# Patient Record
Sex: Female | Born: 1995 | Race: Black or African American | Hispanic: No | Marital: Single | Smoking: Never smoker
Health system: Southern US, Community
[De-identification: ages and names within clinical notes are randomized; demographics above are authoritative.]

## PROBLEM LIST (undated history)

## (undated) ENCOUNTER — Inpatient Hospital Stay (HOSPITAL_COMMUNITY): Payer: Self-pay

## (undated) DIAGNOSIS — F909 Attention-deficit hyperactivity disorder, unspecified type: Secondary | ICD-10-CM

## (undated) DIAGNOSIS — F419 Anxiety disorder, unspecified: Secondary | ICD-10-CM

## (undated) HISTORY — PX: PELVIC LAPAROSCOPY: SHX162

---

## 2015-11-26 ENCOUNTER — Inpatient Hospital Stay (HOSPITAL_COMMUNITY)
Admission: AD | Admit: 2015-11-26 | Discharge: 2015-11-26 | Disposition: A | Discharge status: Home / Self Care | Payer: BLUE CROSS/BLUE SHIELD | Source: Ambulatory Visit | Attending: Obstetrics & Gynecology | Admitting: Obstetrics & Gynecology

## 2015-11-26 ENCOUNTER — Encounter (HOSPITAL_COMMUNITY): Payer: Self-pay | Admitting: *Deleted

## 2015-11-26 DIAGNOSIS — Z3490 Encounter for supervision of normal pregnancy, unspecified, unspecified trimester: Secondary | ICD-10-CM

## 2015-11-26 DIAGNOSIS — Z3200 Encounter for pregnancy test, result unknown: Secondary | ICD-10-CM | POA: Diagnosis present

## 2015-11-26 DIAGNOSIS — Z3201 Encounter for pregnancy test, result positive: Secondary | ICD-10-CM

## 2015-11-26 LAB — POCT PREGNANCY, URINE: Preg Test, Ur: POSITIVE — AB

## 2015-11-26 NOTE — MAU Provider Note (Signed)
S; In to see if preg. Had post test at home. Denies any painor bleeding.  O; VSS A: IUP at 4.6 wks per LMP P[facility]

## 2015-11-26 NOTE — MAU Note (Signed)
Pt presents to MAU for pregnancy test. Denies any vaginal bleeding or pain. 

## 2016-09-30 ENCOUNTER — Encounter (HOSPITAL_COMMUNITY): Payer: Self-pay

## 2019-06-25 ENCOUNTER — Emergency Department (HOSPITAL_COMMUNITY)
Admission: EM | Admit: 2019-06-25 | Discharge: 2019-06-25 | Disposition: A | Discharge status: Home / Self Care | Payer: BC Managed Care – PPO | Attending: Emergency Medicine | Admitting: Emergency Medicine

## 2019-06-25 ENCOUNTER — Encounter: Payer: Self-pay | Admitting: Emergency Medicine

## 2019-06-25 ENCOUNTER — Other Ambulatory Visit: Payer: Self-pay

## 2019-06-25 ENCOUNTER — Emergency Department (HOSPITAL_COMMUNITY): Payer: BC Managed Care – PPO

## 2019-06-25 DIAGNOSIS — N760 Acute vaginitis: Secondary | ICD-10-CM | POA: Diagnosis not present

## 2019-06-25 DIAGNOSIS — Z3202 Encounter for pregnancy test, result negative: Secondary | ICD-10-CM | POA: Insufficient documentation

## 2019-06-25 DIAGNOSIS — Z975 Presence of (intrauterine) contraceptive device: Secondary | ICD-10-CM | POA: Insufficient documentation

## 2019-06-25 DIAGNOSIS — B9689 Other specified bacterial agents as the cause of diseases classified elsewhere: Secondary | ICD-10-CM

## 2019-06-25 LAB — URINALYSIS, ROUTINE W REFLEX MICROSCOPIC
Bilirubin Urine: NEGATIVE
Glucose, UA: NEGATIVE mg/dL
Ketones, ur: NEGATIVE mg/dL
Leukocytes,Ua: NEGATIVE
Nitrite: NEGATIVE
Protein, ur: NEGATIVE mg/dL
Specific Gravity, Urine: 1.024 (ref 1.005–1.030)
pH: 5 (ref 5.0–8.0)

## 2019-06-25 LAB — WET PREP, GENITAL
Sperm: NONE SEEN
Trich, Wet Prep: NONE SEEN
Yeast Wet Prep HPF POC: NONE SEEN

## 2019-06-25 LAB — PREGNANCY, URINE: Preg Test, Ur: NEGATIVE

## 2019-06-25 MED ORDER — METRONIDAZOLE 500 MG PO TABS: 500.0000 mg | ORAL_TABLET | Freq: Two times a day (BID) | ORAL | 0 refills | Status: DC

## 2019-06-25 NOTE — Discharge Instructions (Addendum)
Your pregnancy test was negative today.  Your ultrasound shows that your IUD is lower in the uterus but still in place with no signs of perforation.  It is extremely important that you follow-up with OB/GYN regarding this so that you can have it properly removed.  Please make sure you are using protection while sexually active until this happens.

## 2019-06-25 NOTE — ED Provider Notes (Signed)
[facility] Provider Note   CSN: [id number] Arrival date & time: 06/25/19  1414    History   Chief Complaint Chief Complaint  Patient presents with  . Possible Pregnancy    HPI Olivia Thompson is a 23 y.o. female.     Olivia Thompson is a 23 y.o. female who is otherwise healthy, presents to the ED for evaluation of possible pregnancy.  Patient reports that her menstrual cycle is 2 weeks late.  She has had an IUD in place for 2 years and usually has a regular menstrual cycle monthly with this.  She reports that 2 weeks ago she had some lower abdominal cramping typical for her cycle but no bleeding.  She reports that this is the first time she has missed a period with her IUD.  She reports about 3 months ago they tried to remove her [facility]UD [facility] the [facility] per her request but they were unable to remove it and she reports they said some of the string broke away and they referred her to follow-up with OB/GYN, but she has not followed up with them yet.  She denies any persistent abdominal pain.  No vaginal bleeding or vaginal discharge.  No nausea or vomiting.  No fevers or chills.  She has taken 3 regnancy test at home over the past 2 weeks and all of them have been negative but she is very concerned and wants to be sure.  She has not attempted to follow-up with OB/GYN.     History reviewed. No pertinent past medical history.  There are no active problems to display for this patient.   History reviewed. No pertinent surgical history.   OB History    Gravida  2   Para      Term      Preterm      AB      Living        SAB      TAB      Ectopic      Multiple      Live Births               Home Medications    Prior to Admission medications   Medication Sig Start Date End Date Taking? Authorizing Provider  metroNIDAZOLE (FLAGYL) 500 MG tablet Take 1 tablet (500 mg total) by mouth 2 (two) times daily. One po bid x 7 days  06/25/19   Dartha LodgeFord, Zanyla Klebba N, PA-C    Family History No family history on file.  Social History Social History   Tobacco Use  . Smoking status: Never Smoker  . Smokeless tobacco: Never Used  Substance Use Topics  . Alcohol use: Never    Frequency: Never  . Drug use: Never     Allergies   Patient has no known allergies.   Review of Systems Review of Systems  Constitutional: Negative for chills and fever.  Gastrointestinal: Positive for abdominal pain. Negative for constipation, diarrhea, nausea and vomiting.  Genitourinary: Positive for menstrual problem. Negative for dysuria, flank pain, frequency, vaginal bleeding and vaginal discharge.  All other systems reviewed and are negative.    Physical Exam Updated Vital Signs BP 129/70 (BP Location: Right Arm)   Pulse 85   Temp 98.5 F (36.9 C) (Oral)   Resp 14   LMP 05/24/2019   SpO2 100%   Physical Exam Vitals signs and nursing note reviewed.  Constitutional:      General: She is not  in acute distress.    Appearance: She is well-developed. She is not diaphoretic.  HENT:     Head: Normocephalic and atraumatic.  Eyes:     General:        Right eye: No discharge.        Left eye: No discharge.     Pupils: Pupils are equal, round, and reactive to light.  Neck:     Musculoskeletal: Neck supple.  Cardiovascular:     Rate and Rhythm: Normal rate and regular rhythm.     Heart sounds: Normal heart sounds. No murmur. No friction rub. No gallop.   Pulmonary:     Effort: Pulmonary effort is normal. No respiratory distress.     Breath sounds: Normal breath sounds. No wheezing or rales.     Comments: Respirations equal and unlabored, patient able to speak in full sentences, lungs clear to auscultation bilaterally Abdominal:     General: Bowel sounds are normal. There is no distension.     Palpations: Abdomen is soft. There is no mass.     Tenderness: There is no abdominal tenderness. There is no guarding.     Comments:  Abdomen soft, nondistended, nontender to palpation in all quadrants without guarding or peritoneal signs  Genitourinary:    Comments: Chaperone present during pelvic exam. No external genital lesions noted. Speculum exam reveals small amount of thin gray discharge, no erythema of the vaginal walls or cervix.  Unable to visualize IUD strings.  On bimanual exam unable to palpate IUD strings.  No focal uterine or adnexal tenderness. Musculoskeletal:        General: No deformity.  Skin:    General: Skin is warm and dry.     Capillary Refill: Capillary refill takes less than 2 seconds.  Neurological:     Mental Status: She is alert.     Coordination: Coordination normal.     Comments: Speech is clear, able to follow commands Moves extremities without ataxia, coordination intact  Psychiatric:        Mood and Affect: Mood normal.        Behavior: Behavior normal.      ED Treatments / Results  Labs (all labs ordered are listed, but only abnormal results are displayed) Labs Reviewed  WET PREP, GENITAL - Abnormal; Notable for the following components:      Result Value   Clue Cells Wet Prep HPF POC PRESENT (*)    WBC, Wet Prep HPF POC MODERATE (*)    All other components within normal limits  URINALYSIS, ROUTINE W REFLEX MICROSCOPIC - Abnormal; Notable for the following components:   APPearance HAZY (*)    Hgb urine dipstick MODERATE (*)    Bacteria, UA RARE (*)    All other components within normal limits  PREGNANCY, URINE    EKG None  Radiology US Transvaginal Non-ob  Result Date: 06/25/2019 CLINICAL DATA:  Intermittent lower abdominal pain. Recently tried to have her IUD removed without success. Can no longer visualized the IUD strings. EXAM: TRANSABDOMINAL AND TRANSVAGINAL ULTRASOUND OF PELVIS TECHNIQUE: Both transabdominal and transvaginal ultrasound examinations of the pelvis were performed. Transabdominal technique was performed for global imaging of the pelvis including  uterus, ovaries, adnexal regions, and pelvic cul-de-sac. It was necessary to proceed with endovaginal exam following the transabdominal exam to visualize the uterus, ovaries and endometrium in better detail. COMPARISON:  Obstetrical ultrasound dated 03/06/2016. FINDINGS: Uterus Measurements: 7.9 x 3.8 x 3.4 cm = volume: 52 mL. No fibroids or other  mass visualized. Endometrium Thickness: 1.7 mm. The endometrium has a normal appearance with artifact from the patient's intrauterine device in the lower uterine segment. Right ovary Measurements: 4.6 x 4.6 x 3.4 cm = volume: 37 mL. 3.6 cm simple cyst. Otherwise, normal. Left ovary Measurements: 3.6 x 2.8 x 1.8 cm = volume: 9 mL. 2.6 cm simple follicular cyst. Other findings No abnormal free fluid. IMPRESSION: 1. The intrauterine device is in the endometrial cavity of the lower uterine segment. 2. No acute abnormality. Electronically Signed   By: Beckie SaltsSteven  Reid M.D.   On: 06/25/2019 17:23   [place] Pelvis Complete  Result Date: 06/25/2019 CLINICAL DATA:  Intermittent lower abdominal pain. Recently tried to have her IUD removed without success. Can no longer visualized the IUD strings. EXAM: TRANSABDOMINAL AND TRANSVAGINAL ULTRASOUND OF PELVIS TECHNIQUE: Both transabdominal and transvaginal ultrasound examinations of the pelvis were performed. Transabdominal technique was performed for global imaging of the pelvis including uterus, ovaries, adnexal regions, and pelvic cul-de-sac. It was necessary to proceed with endovaginal exam following the transabdominal exam to visualize the uterus, ovaries and endometrium in better detail. COMPARISON:  Obstetrical ultrasound dated 03/06/2016. FINDINGS: Uterus Measurements: 7.9 x 3.8 x 3.4 cm = volume: 52 mL. No fibroids or other mass visualized. Endometrium Thickness: 1.7 mm. The endometrium has a normal appearance with artifact from the patient's intrauterine device in the lower uterine segment. Right ovary Measurements: 4.6 x 4.6 x 3.4  cm = volume: 37 mL. 3.6 cm simple cyst. Otherwise, normal. Left ovary Measurements: 3.6 x 2.8 x 1.8 cm = volume: 9 mL. 2.6 cm simple follicular cyst. Other findings No abnormal free fluid. IMPRESSION: 1. The intrauterine device is in the endometrial cavity of the lower uterine segment. 2. No acute abnormality. Electronically Signed   By: Beckie SaltsSteven  Reid M.D.   On: 06/25/2019 17:23    Procedures Procedures (including critical care time)  Medications Ordered in ED Medications - No data to display   Initial Impression / Assessment and Plan / ED Course  I have reviewed the triage vital signs and the nursing notes.  Pertinent labs & imaging results that were available during my care of the patient were reviewed by me and considered in my medical decision making (see chart for details).  Patient presents with concern for possible pregnancy, IUD in place also tried to have this removed a few months ago unsuccessfully and has not followed up with OB/GYN.  2 weeks late for her period.  No vaginal discharge, had some abdominal cramping during time of cycle but no bleeding.  On exam she has some thin gray discharge, I am unable to visualize her IUD strings or palpate them, I discussed this with the patient she became very concerned.  She would like to proceed with ultrasound to assess placement of her IUD.  Wet prep also collected.  Patient's urine pregnancy test is negative and urinalysis without signs of infection.  Wet prep consistent with BV, make sense given patient's exam.  Ultrasound shows that IUD is present within the lower uterine segment with no evidence of perforation.  Discussed this with the patient.  Stressed the importance of her following up with OB/GYN and using additional form of contraception.  Flagyl for BV.  Return precautions discussed.  Patient expresses understanding and agreement with plan.  Discharged home in good condition.  Final Clinical Impressions(s) / ED Diagnoses   Final  diagnoses:  Negative pregnancy test  IUD (intrauterine device) in place  BV (bacterial vaginosis)  ED Discharge Orders         Ordered    metroNIDAZOLE (FLAGYL) 500 MG tablet  2 times daily     06/25/19 1825           Legrand RamsFord, Shauntae Reitman N, PA-C 06/27/19 2236    Geoffery Lyonselo, [place], MD 06/30/19 [phone]

## 2019-06-25 NOTE — ED Notes (Signed)
Pt states she is 2 weeks late for her period, pt has had an IUD for 2 years.

## 2019-06-25 NOTE — ED Notes (Signed)
This RN documented in error on this patient.

## 2019-06-25 NOTE — ED Notes (Signed)
Pt st's she has had 3 neg home preg test but just wants to be sure.  St's her period is 2 weeks late.

## 2020-07-03 IMAGING — US US PELVIS COMPLETE
1 series · 13 of 25 positions shown · non-contrast
Comparison: Obstetrical ultrasound dated 03/06/2016.

CLINICAL DATA: Intermittent lower abdominal pain. Recently tried to
have her IUD removed without success. Can no longer visualized the
IUD strings.

EXAM:
TRANSABDOMINAL AND TRANSVAGINAL ULTRASOUND OF PELVIS
TECHNIQUE: Both transabdominal and transvaginal ultrasound examinations of the
pelvis were performed. Transabdominal technique was performed for
global imaging of the pelvis including uterus, ovaries, adnexal
regions, and pelvic cul-de-sac. It was necessary to proceed with
endovaginal exam following the transabdominal exam to visualize the
uterus, ovaries and endometrium in better detail.

[Series 1: us pelvis complete · 13 of 107 slices shown]
[im 1/107]
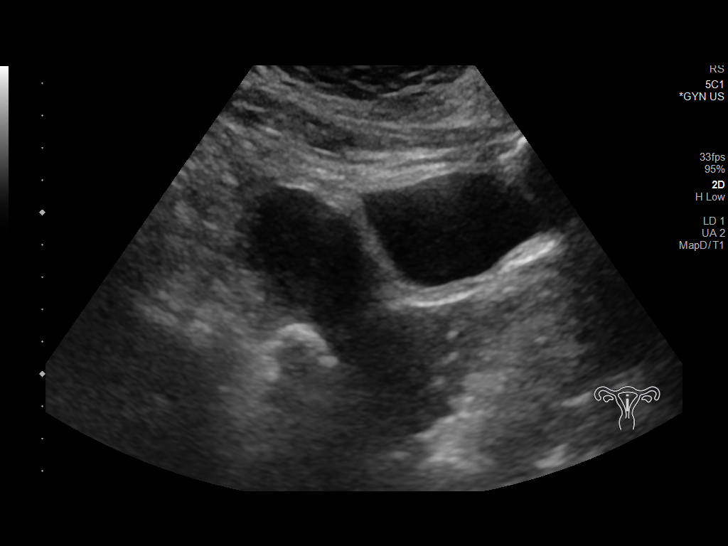
[im 9/107]
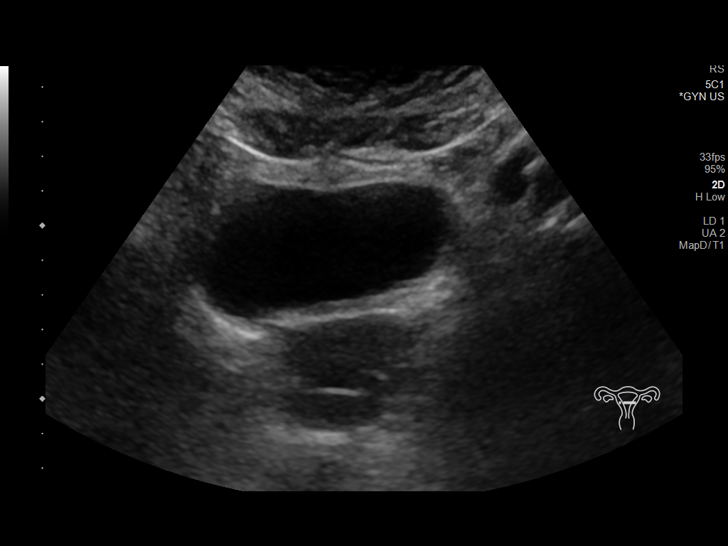
[im 18/107]
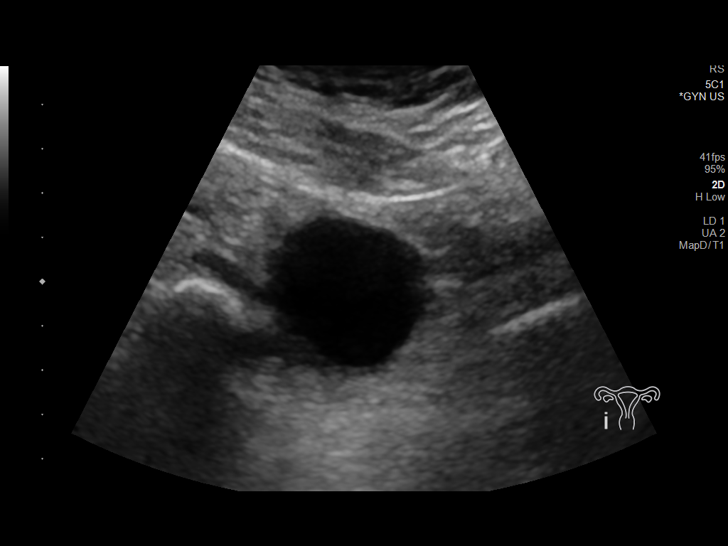
[im 27/107]
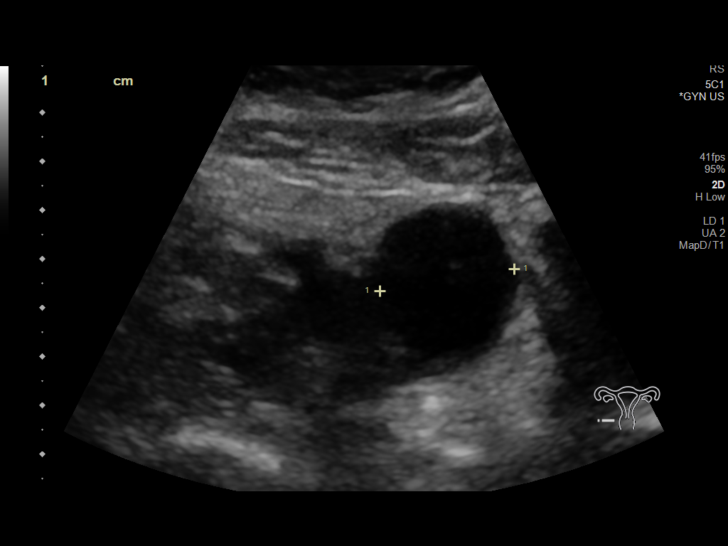
[im 36/107]
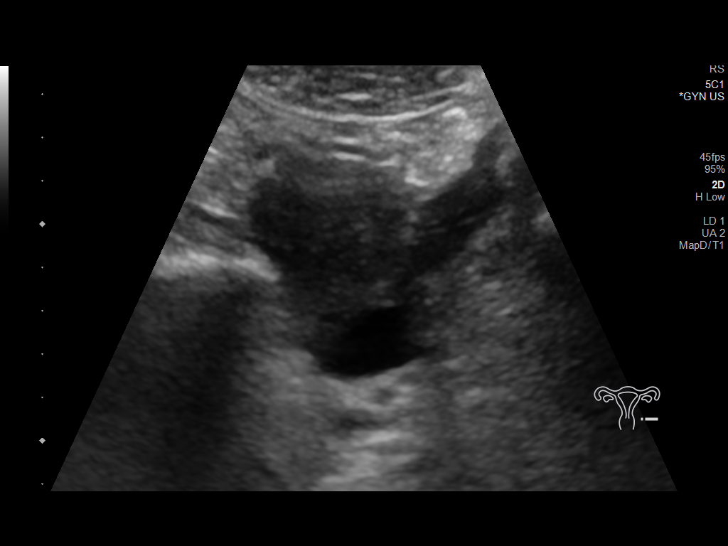
[im 45/107]
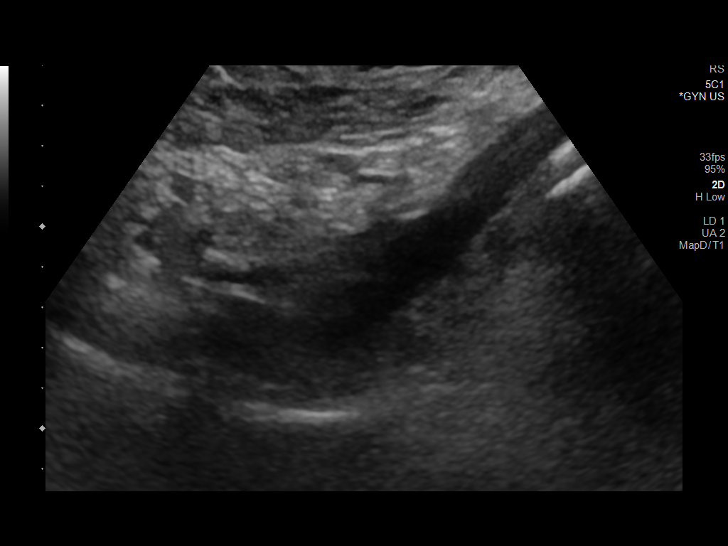
[im 54/107]
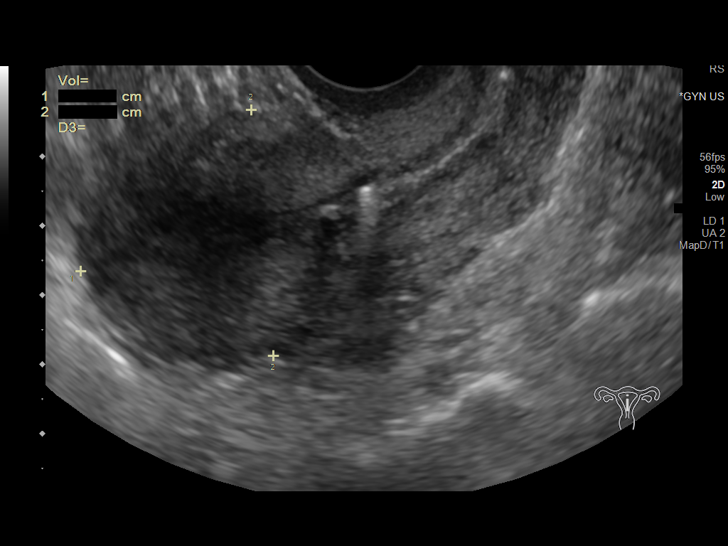
[im 62/107]
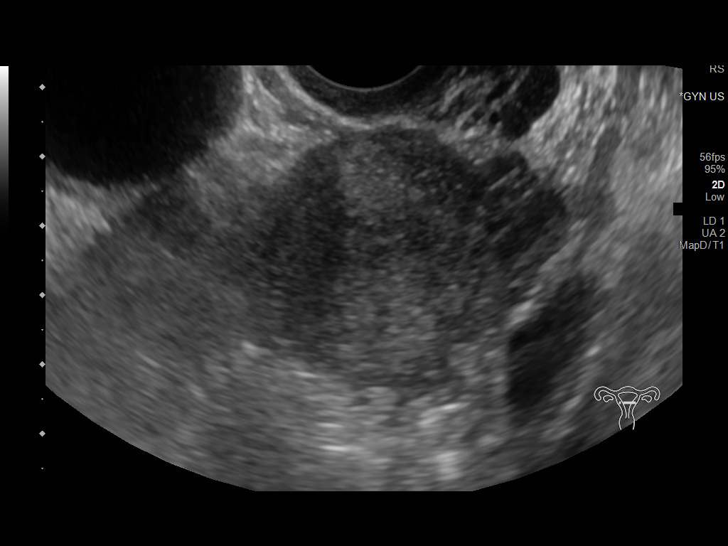
[im 71/107]
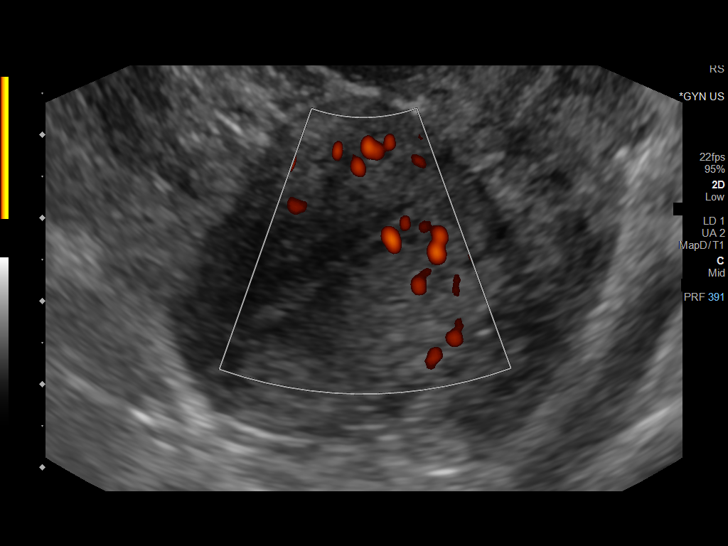
[im 80/107]
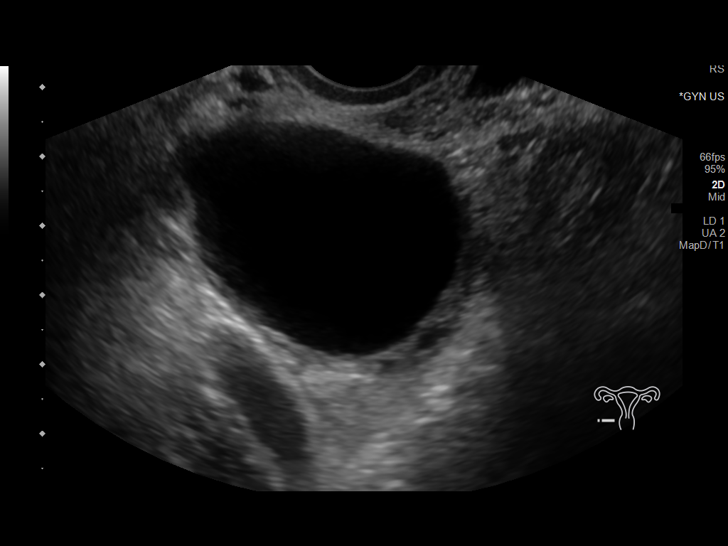
[im 89/107]
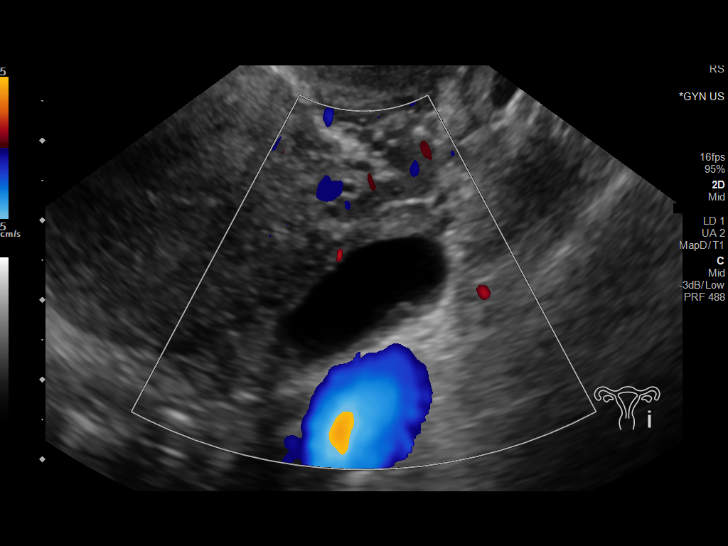
[im 98/107]
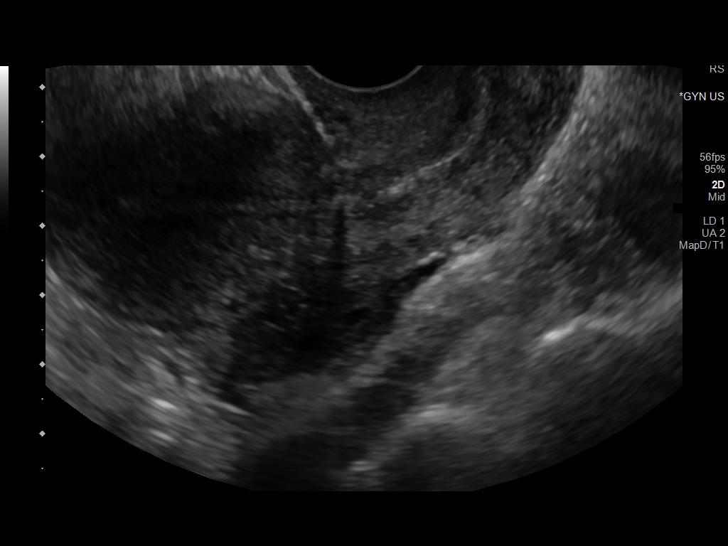
[im 107/107]
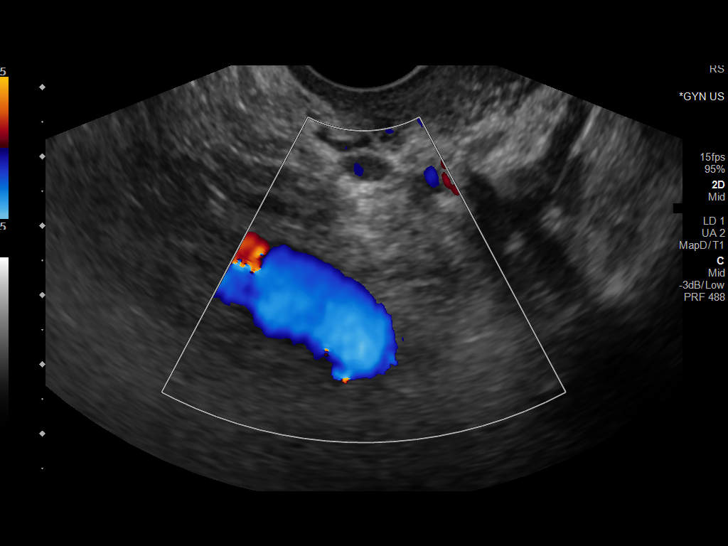

[13 of 25 positions shown; findings below may reference images not displayed]

FINDINGS: Uterus

Measurements: 7.9 x 3.8 x 3.4 cm = volume: 52 mL. No fibroids or
other mass visualized.

Endometrium

Thickness: 1.7 mm. The endometrium has a normal appearance with
artifact from the patient's intrauterine device in the lower uterine
segment.

Right ovary

Measurements: 4.6 x 4.6 x 3.4 cm = volume: 37 mL. 3.6 cm simple
cyst. Otherwise, normal.

Left ovary

Measurements: 3.6 x 2.8 x 1.8 cm = volume: 9 mL. 2.6 cm simple
follicular cyst.

Other findings

No abnormal free fluid.
IMPRESSION: 1. The intrauterine device is in the endometrial cavity of the lower
uterine segment.
2. No acute abnormality.

## 2020-08-21 ENCOUNTER — Other Ambulatory Visit: Payer: Self-pay

## 2020-08-21 DIAGNOSIS — J029 Acute pharyngitis, unspecified: Secondary | ICD-10-CM | POA: Insufficient documentation

## 2020-08-21 DIAGNOSIS — R509 Fever, unspecified: Secondary | ICD-10-CM | POA: Insufficient documentation

## 2020-08-22 ENCOUNTER — Emergency Department (HOSPITAL_COMMUNITY)
Admission: EM | Admit: 2020-08-22 | Discharge: 2020-08-22 | Disposition: A | Discharge status: Home / Self Care | Payer: BC Managed Care – PPO | Attending: Emergency Medicine | Admitting: Emergency Medicine

## 2020-08-22 ENCOUNTER — Encounter (HOSPITAL_COMMUNITY): Payer: Self-pay | Admitting: Emergency Medicine

## 2020-08-22 DIAGNOSIS — J020 Streptococcal pharyngitis: Secondary | ICD-10-CM

## 2020-08-22 LAB — GROUP A STREP BY PCR: Group A Strep by PCR: DETECTED — AB

## 2020-08-22 MED ORDER — ACETAMINOPHEN 500 MG PO TABS: 500.0000 mg | ORAL_TABLET | Freq: Four times a day (QID) | ORAL | 0 refills | Status: DC | PRN

## 2020-08-22 MED ORDER — IBUPROFEN 600 MG PO TABS: 600.0000 mg | ORAL_TABLET | Freq: Four times a day (QID) | ORAL | 0 refills | Status: DC | PRN

## 2020-08-22 MED ORDER — PENICILLIN G BENZATHINE 1200000 UNIT/2ML IM SUSP
1.2000 10*6.[IU] | Freq: Once | INTRAMUSCULAR | Status: AC
  Administered 2020-08-22: 1.2 10*6.[IU] via INTRAMUSCULAR
  Filled 2020-08-22: qty 2

## 2020-08-22 MED ORDER — PHENOL 1.4 % MT LIQD: 1.0000 | OROMUCOSAL | 0 refills | Status: DC | PRN

## 2020-08-22 MED ORDER — DEXAMETHASONE SODIUM PHOSPHATE 10 MG/ML IJ SOLN
10.0000 mg | Freq: Once | INTRAMUSCULAR | Status: AC
  Administered 2020-08-22: 10 mg via INTRAMUSCULAR
  Filled 2020-08-22: qty 1

## 2020-08-22 NOTE — ED Triage Notes (Signed)
Pt c/o sore throat and R ear pain x 24 hours, pt reports white spots on tonsils. Difficulty swallowing.

## 2020-08-22 NOTE — Discharge Instructions (Signed)
Your strep test today was positive.  We treated this with an antibiotic shot and a steroid shot.  You will be considered infectious for the next 24 hours but after that can return to work.  Alternate 600 mg of ibuprofen and [phone] mg of Tylenol every 3 hours as needed for pain and fever. Do not exceed 4000 mg of Tylenol daily.  Take ibuprofen with food to avoid upset stomach issues.  Drink plenty of water and get plenty of rest.  Use warm water salt gargles, warm teas, honey, Chloraseptic spray, and throat lozenges for management of your sore throat pain.  Follow-up with your PCP in 3 to 5 days if symptoms persist.  Return to the emergency department immediately for any concerning signs or symptoms develop such as fever not controlled by medication, persistent vomiting, throat tightness, drooling.

## 2020-08-22 NOTE — ED Notes (Signed)
Patient verbalizes understanding of discharge instructions. Opportunity for questioning and answers were provided. Armband removed by staff, pt discharged from ED. Pt. ambulatory and discharged home.  

## 2020-08-22 NOTE — ED Provider Notes (Signed)
[facility] EMERGENCY DEPARTMENT Provider Note   CSN: [id number] Arrival date & time: 08/21/20  2150     History Chief Complaint  Patient presents with  . Sore Throat    Olivia Thompson is a 24 y.o. female presents for evaluation of acute onset, persistent sore throat for 2 days.  Notes low-grade fevers.  Endorses pain with swallowing but is able to do so.  Denies cough, nasal congestion, shortness of breath, chest pain.  No known sick contacts.  Has been using cough drops with little relief.  Is not vaccinated against Covid.  The history is provided by the patient.       History reviewed. No pertinent past medical history.  There are no problems to display for this patient.   History reviewed. No pertinent surgical history.   OB History    Gravida  2   Para      Term      Preterm      AB      Living        SAB      TAB      Ectopic      Multiple      Live Births              No family history on file.  Social History   Tobacco Use  . Smoking status: Never Smoker  . Smokeless tobacco: Never Used  Substance Use Topics  . Alcohol use: Never  . Drug use: Never    Home Medications Prior to Admission medications   Medication Sig Start Date End Date Taking? Authorizing Provider  acetaminophen (TYLENOL) 500 MG tablet Take 1 tablet (500 mg total) by mouth every 6 (six) hours as needed. 08/22/20   Melik Blancett A, PA-C  ibuprofen (ADVIL) 600 MG tablet Take 1 tablet (600 mg total) by mouth every 6 (six) hours as needed. 08/22/20   [place] Rosamond A, PA-C  metroNIDAZOLE (FLAGYL) 500 MG tablet Take 1 tablet (500 mg total) by mouth 2 (two) times daily. One po bid x 7 days 06/25/19   Dartha Lodge, PA-C  phenol (CHLORASEPTIC) 1.4 % LIQD Use as directed 1 spray in the mouth or throat as needed for throat irritation / pain. 08/22/20   Michela Pitcher A, PA-C    Allergies    Patient has no known allergies.  Review of Systems   Review of Systems    Constitutional: Positive for chills and fever.  HENT: Positive for sore throat and trouble swallowing. Negative for congestion.   Respiratory: Negative for cough and shortness of breath.   Cardiovascular: Negative for chest pain.  Gastrointestinal: Negative for nausea and vomiting.    Physical Exam Updated Vital Signs BP 114/73 (BP Location: Left Arm)   Pulse 98   Temp 98.4 F (36.9 C) (Oral)   Resp 16   Ht 5\' 4"  (1.626 m)   LMP 08/09/2020   SpO2 100%   Breastfeeding Unknown   Physical Exam Vitals and nursing note reviewed.  Constitutional:      General: She is not in acute distress.    Appearance: She is well-developed.  HENT:     Head: Normocephalic and atraumatic.     Mouth/Throat:     Mouth: Mucous membranes are moist.     Pharynx: Uvula midline.     Tonsils: Tonsillar exudate present. No tonsillar abscesses. 3+ on the right. 3+ on the left.  Eyes:     General:  Right eye: No discharge.        Left eye: No discharge.     Conjunctiva/sclera: Conjunctivae normal.  Neck:     Vascular: No JVD.     Trachea: No tracheal deviation.     Comments: Bilateral anterior cervical lymphadenopathy Cardiovascular:     Rate and Rhythm: Normal rate and regular rhythm.  Pulmonary:     Effort: Pulmonary effort is normal.     Breath sounds: Normal breath sounds.  Abdominal:     General: There is no distension.     Palpations: Abdomen is soft.     Tenderness: There is no abdominal tenderness. There is no rebound.  Musculoskeletal:     Cervical back: Normal range of motion and neck supple.  Lymphadenopathy:     Cervical: Cervical adenopathy present.  Skin:    General: Skin is warm and dry.     Findings: No erythema.  Neurological:     Mental Status: She is alert.  Psychiatric:        Behavior: Behavior normal.     ED Results / Procedures / Treatments   Labs (all labs ordered are listed, but only abnormal results are displayed) Labs Reviewed  GROUP A STREP BY PCR  - Abnormal; Notable for the following components:      Result Value   Group A Strep by PCR DETECTED (*)    All other components within normal limits    EKG None  Radiology No results found.  Procedures Procedures (including critical care time)  Medications Ordered in ED Medications  dexamethasone (DECADRON) injection 10 mg (10 mg Intramuscular Given 08/22/20 1152)  penicillin g benzathine (BICILLIN LA) 1200000 UNIT/2ML injection 1.2 Million Units (1.2 Million Units Intramuscular Given 08/22/20 1152)    ED Course  I have reviewed the triage vital signs and the nursing notes.  Pertinent labs & imaging results that were available during my care of the patient were reviewed by me and considered in my medical decision making (see chart for details).    MDM Rules/Calculators/A&P                          Patient presenting for evaluation of sore throat, low-grade fevers for 2 days.  She is afebrile in the ED, vital signs are stable.  She is uncomfortable but nontoxic in appearance.  Exhibits bilateral anterior cervical lymphadenopathy. She is spitting into an emesis bag not because she cannot swallow but because it is painful to do so.  She is able to sip on fluids in the ED.  No inspiratory stridor noted.  Lungs clear to auscultation bilaterally.  No known Covid exposures and she declines Covid test today.  Physical examination does not suggest peritonsillar abscess, deep space neck infection, or Ludwig's angina.  Strep test is positive, will treat with IM penicillin and IM Decadron.  Discussed symptomatic management, recommend pushing fluids.  Discussed strict ED return precautions.  Patient verbalized understanding of and agreement with plan and patient is stable for discharge at this time.    Final Clinical Impression(s) / ED Diagnoses Final diagnoses:  Acute streptococcal pharyngitis    Rx / DC Orders ED Discharge Orders         Ordered    phenol (CHLORASEPTIC) 1.4 % LIQD  As  needed,   Status:  Discontinued        08/22/20 1223    ibuprofen (ADVIL) 600 MG tablet  Every 6 hours PRN,  Status:  Discontinued        08/22/20 1223    acetaminophen (TYLENOL) 500 MG tablet  Every 6 hours PRN,   Status:  Discontinued        08/22/20 1223    acetaminophen (TYLENOL) 500 MG tablet  Every 6 hours PRN        08/22/20 1229    ibuprofen (ADVIL) 600 MG tablet  Every 6 hours PRN        08/22/20 1229    phenol (CHLORASEPTIC) 1.4 % LIQD  As needed        08/22/20 1229           Jeanie Sewer, PA-C 08/22/20 1233    Melene Plan, DO 08/22/20 1450

## 2020-09-16 ENCOUNTER — Ambulatory Visit (HOSPITAL_COMMUNITY)
Admission: EM | Admit: 2020-09-16 | Discharge: 2020-09-16 | Disposition: A | Discharge status: Home / Self Care | Payer: BC Managed Care – PPO | Attending: Family Medicine | Admitting: Family Medicine

## 2020-09-16 ENCOUNTER — Other Ambulatory Visit: Payer: Self-pay

## 2020-09-16 ENCOUNTER — Encounter (HOSPITAL_COMMUNITY): Payer: Self-pay

## 2020-09-16 DIAGNOSIS — R197 Diarrhea, unspecified: Secondary | ICD-10-CM | POA: Insufficient documentation

## 2020-09-16 DIAGNOSIS — Z1152 Encounter for screening for COVID-19: Secondary | ICD-10-CM | POA: Diagnosis not present

## 2020-09-16 DIAGNOSIS — J029 Acute pharyngitis, unspecified: Secondary | ICD-10-CM | POA: Diagnosis not present

## 2020-09-16 DIAGNOSIS — R509 Fever, unspecified: Secondary | ICD-10-CM | POA: Diagnosis not present

## 2020-09-16 DIAGNOSIS — R112 Nausea with vomiting, unspecified: Secondary | ICD-10-CM | POA: Insufficient documentation

## 2020-09-16 LAB — POCT RAPID STREP A, ED / UC: Streptococcus, Group A Screen (Direct): NEGATIVE

## 2020-09-16 MED ORDER — LIDOCAINE VISCOUS HCL 2 % MT SOLN: 20.0000 mL | OROMUCOSAL | 0 refills | Status: DC | PRN

## 2020-09-16 MED ORDER — ACETAMINOPHEN 325 MG PO TABS: 650.0000 mg | ORAL_TABLET | Freq: Once | ORAL | Status: DC

## 2020-09-16 MED ORDER — ACETAMINOPHEN 325 MG PO TABS
ORAL_TABLET | ORAL | Status: AC
  Filled 2020-09-16: qty 2

## 2020-09-16 MED ORDER — ONDANSETRON 8 MG PO TBDP: 8.0000 mg | ORAL_TABLET | Freq: Three times a day (TID) | ORAL | 0 refills | Status: DC | PRN

## 2020-09-16 MED ORDER — AMOXICILLIN-POT CLAVULANATE 875-125 MG PO TABS: 1.0000 | ORAL_TABLET | Freq: Two times a day (BID) | ORAL | 0 refills | Status: DC

## 2020-09-16 NOTE — ED Provider Notes (Signed)
[facility]    CSN: [id number] Arrival date & time: 09/16/20  1104      History   Chief Complaint Chief Complaint  Patient presents with   Sore Throat   Emesis   Diarrhea    HPI Olivia Thompson is a 24 y.o. female.   Here today with 3 day history of sore, swollen throat, feeling hot but hadn't checked a temperature, and N/V/D, fatigue. Denies cough, nasal congestion, CP, dizziness, syncope. Has been able to tolerate fluids but not food. States she just had strep throat last month and it felt similar. Denies known sick contacts.      History reviewed. No pertinent past medical history.  There are no problems to display for this patient.   History reviewed. No pertinent surgical history.  OB History    Gravida  2   Para      Term      Preterm      AB      Living        SAB      TAB      Ectopic      Multiple      Live Births               Home Medications    Prior to Admission medications   Medication Sig Start Date End Date Taking? Authorizing Provider  acetaminophen (TYLENOL) 500 MG tablet Take 1 tablet (500 mg total) by mouth every 6 (six) hours as needed. 08/22/20   Fawze, Mina A, PA-C  amoxicillin-clavulanate (AUGMENTIN) 875-125 MG tablet Take 1 tablet by mouth every 12 (twelve) hours. 09/16/20   Particia Nearing, PA-C  ibuprofen (ADVIL) 600 MG tablet Take 1 tablet (600 mg total) by mouth every 6 (six) hours as needed. 08/22/20   Fawze, Mina A, PA-C  lidocaine (XYLOCAINE) 2 % solution Use as directed 20 mLs in the mouth or throat as needed for mouth pain. 09/16/20   Particia Nearing, PA-C  metroNIDAZOLE (FLAGYL) 500 MG tablet Take 1 tablet (500 mg total) by mouth 2 (two) times daily. One po bid x 7 days 06/25/19   Dartha Lodge, PA-C  ondansetron (ZOFRAN ODT) 8 MG disintegrating tablet Take 1 tablet (8 mg total) by mouth every 8 (eight) hours as needed for nausea or vomiting. 09/16/20   Particia Nearing, PA-C  phenol  (CHLORASEPTIC) 1.4 % LIQD Use as directed 1 spray in the mouth or throat as needed for throat irritation / pain. 08/22/20   Jeanie Sewer, PA-C    Family History History reviewed. No pertinent family history.  Social History Social History   Tobacco Use   Smoking status: Never Smoker   Smokeless tobacco: Never Used  Substance Use Topics   Alcohol use: Never   Drug use: Never     Allergies   Patient has no known allergies.   Review of Systems Review of Systems PER HPI    Physical Exam Triage Vital Signs ED Triage Vitals  Enc Vitals Group     BP 09/16/20 1235 123/72     Pulse Rate 09/16/20 1235 (!) 119     Resp 09/16/20 1235 20     Temp 09/16/20 1235 (!) 102.7 F (39.3 C)     Temp Source 09/16/20 1235 Oral     SpO2 09/16/20 1235 100 %     Weight --      Height --      Head Circumference --  Peak Flow --      Pain Score 09/16/20 1237 7     Pain Loc --      Pain Edu? --      Excl. in GC? --    No data found.  Updated Vital Signs BP 123/72 (BP Location: Right Arm)    Pulse (!) 119    Temp (!) 102.7 F (39.3 C) (Oral)    Resp 20    SpO2 100%   Visual Acuity Right Eye Distance:   Left Eye Distance:   Bilateral Distance:    Right Eye Near:   Left Eye Near:    Bilateral Near:     Physical Exam Vitals and nursing note reviewed.  Constitutional:      Appearance: Normal appearance. She is not ill-appearing.  HENT:     Head: Atraumatic.     Right Ear: Tympanic membrane normal.     Left Ear: Tympanic membrane normal.     Nose: Nose normal.     Mouth/Throat:     Mouth: Mucous membranes are moist.     Comments: B/l tonsils significantly erythematous, edematous with copious exudates Eyes:     Extraocular Movements: Extraocular movements intact.     Conjunctiva/sclera: Conjunctivae normal.  Cardiovascular:     Rate and Rhythm: Normal rate and regular rhythm.     Heart sounds: Normal heart sounds.  Pulmonary:     Effort: Pulmonary effort is  normal.     Breath sounds: Normal breath sounds.  Abdominal:     General: Bowel sounds are normal. There is no distension.     Palpations: Abdomen is soft.     Tenderness: There is no abdominal tenderness. There is no right CVA tenderness, left CVA tenderness or guarding.  Musculoskeletal:        General: Normal range of motion.     Cervical back: Normal range of motion and neck supple.  Lymphadenopathy:     Cervical: Cervical adenopathy (diffuse) present.  Skin:    General: Skin is warm and dry.  Neurological:     Mental Status: She is alert and oriented to person, place, and time.  Psychiatric:        Mood and Affect: Mood normal.        Thought Content: Thought content normal.        Judgment: Judgment normal.      UC Treatments / Results  Labs (all labs ordered are listed, but only abnormal results are displayed) Labs Reviewed  SARS CORONAVIRUS 2 (TAT 6-24 HRS)  CULTURE, GROUP A STREP [facility])  POCT RAPID STREP A, ED / UC    EKG   Radiology No results found.  Procedures Procedures (including critical care time)  Medications Ordered in UC Medications  acetaminophen (TYLENOL) tablet 650 mg (650 mg Oral Not Given 09/16/20 1251)    Initial Impression / Assessment and Plan / UC Course  I have reviewed the triage vital signs and the nursing notes.  Pertinent labs & imaging results that were available during my care of the patient were reviewed by me and considered in my medical decision making (see chart for details).     Rapid strep neg, throat culture and COVID pcr pending. Given appearance of her tonsils and significant fever and tachycardia suspect bacterial infection despite rapid strep being negative. Will tx with augmentin, viscous lidocaine, and zofran for her N/V. OTC fever and pain reducers. Return precautions reviewed and instructed to isolate until COVID results return.  Final Clinical  Impressions(s) / UC Diagnoses   Final diagnoses:  Encounter for  screening for COVID-19  Nausea vomiting and diarrhea  Pharyngitis, unspecified etiology  Fever, unspecified   Discharge Instructions   None    ED Prescriptions    Medication Sig Dispense Auth. Provider   lidocaine (XYLOCAINE) 2 % solution Use as directed 20 mLs in the mouth or throat as needed for mouth pain. 100 mL Particia Nearing, PA-C   amoxicillin-clavulanate (AUGMENTIN) 875-125 MG tablet Take 1 tablet by mouth every 12 (twelve) hours. 20 tablet Particia Nearing, PA-C   ondansetron (ZOFRAN ODT) 8 MG disintegrating tablet Take 1 tablet (8 mg total) by mouth every 8 (eight) hours as needed for nausea or vomiting. 21 tablet Particia Nearing, [place]     PDMP not reviewed this encounter.   Particia Nearing, [place] 09/16/20 1401

## 2020-09-16 NOTE — ED Triage Notes (Signed)
Pt present sore throat N/V/D symptom started 3 days ago.

## 2020-09-16 NOTE — ED Notes (Addendum)
PT refuses  Tylenol caps. Pt offered liquid tylenol but refused it also . Pt is aware temp is 102.7

## 2020-09-17 ENCOUNTER — Telehealth (HOSPITAL_COMMUNITY): Payer: Self-pay

## 2020-09-17 LAB — CULTURE, GROUP A STREP (THRC)

## 2020-09-17 LAB — SARS CORONAVIRUS 2 (TAT 6-24 HRS): SARS Coronavirus 2: NEGATIVE

## 2020-09-17 MED ORDER — AMOXICILLIN-POT CLAVULANATE 400-57 MG/5ML PO SUSR: 875.0000 mg | Freq: Two times a day (BID) | ORAL | 0 refills | Status: AC

## 2020-09-17 NOTE — Telephone Encounter (Signed)
Pt called requesting liquid antibiotic. Notified R. Maurice March of pt request, Richrd Prime stated she would send abx to pharmacy. Advised pt of same. Recent stated weight of 170lbs.

## 2020-09-17 NOTE — Telephone Encounter (Signed)
Liquid abx sent

## 2020-09-18 LAB — CULTURE, GROUP A STREP (THRC)

## 2020-09-19 LAB — CULTURE, GROUP A STREP (THRC)

## 2020-10-25 ENCOUNTER — Other Ambulatory Visit: Payer: Self-pay

## 2020-10-25 ENCOUNTER — Encounter (HOSPITAL_COMMUNITY): Payer: Self-pay

## 2020-10-25 ENCOUNTER — Ambulatory Visit (HOSPITAL_COMMUNITY)
Admission: EM | Admit: 2020-10-25 | Discharge: 2020-10-25 | Disposition: A | Discharge status: Home / Self Care | Payer: BC Managed Care – PPO | Attending: Emergency Medicine | Admitting: Emergency Medicine

## 2020-10-25 DIAGNOSIS — U071 COVID-19: Secondary | ICD-10-CM | POA: Diagnosis not present

## 2020-10-25 DIAGNOSIS — Z3202 Encounter for pregnancy test, result negative: Secondary | ICD-10-CM | POA: Diagnosis not present

## 2020-10-25 DIAGNOSIS — R110 Nausea: Secondary | ICD-10-CM

## 2020-10-25 DIAGNOSIS — N390 Urinary tract infection, site not specified: Secondary | ICD-10-CM

## 2020-10-25 DIAGNOSIS — J069 Acute upper respiratory infection, unspecified: Secondary | ICD-10-CM

## 2020-10-25 DIAGNOSIS — R059 Cough, unspecified: Secondary | ICD-10-CM

## 2020-10-25 LAB — POCT URINALYSIS DIPSTICK, ED / UC
Bilirubin Urine: NEGATIVE
Glucose, UA: NEGATIVE mg/dL
Ketones, ur: 15 mg/dL — AB
Nitrite: NEGATIVE
Protein, ur: NEGATIVE mg/dL
Specific Gravity, Urine: 1.02 (ref 1.005–1.030)
Urobilinogen, UA: 0.2 mg/dL (ref 0.0–1.0)
pH: 5.5 (ref 5.0–8.0)

## 2020-10-25 LAB — SARS CORONAVIRUS 2 (TAT 6-24 HRS): SARS Coronavirus 2: POSITIVE — AB

## 2020-10-25 LAB — POC URINE PREG, ED: Preg Test, Ur: NEGATIVE

## 2020-10-25 MED ORDER — NITROFURANTOIN MONOHYD MACRO 100 MG PO CAPS: 100.0000 mg | ORAL_CAPSULE | Freq: Two times a day (BID) | ORAL | 0 refills | Status: AC

## 2020-10-25 MED ORDER — FLUTICASONE PROPIONATE 50 MCG/ACT NA SUSP
1.0000 | Freq: Every day | NASAL | 0 refills | Status: DC
Start: 2020-10-25 — End: 2022-06-21

## 2020-10-25 MED ORDER — BENZONATATE 200 MG PO CAPS: 200.0000 mg | ORAL_CAPSULE | Freq: Three times a day (TID) | ORAL | 0 refills | Status: AC | PRN

## 2020-10-25 NOTE — Discharge Instructions (Signed)
Covid test pending Flonase to help with nasal congestion Tessalon every 8 hours for cough  Urine did show signs of infection Begin Macrobid twice daily for 5 days Push fluids  Follow-up if not improving or worsening

## 2020-10-25 NOTE — ED Triage Notes (Signed)
Pt presents with a loss of smell x 4 days, and cough. She states she can still taste and she believes c/o frequency x 1 week. Pt states she took a UTI at home test bought from [employer]. She states that it resulted positive. Pt states she feels nausea x 2 days. Pt states she has only taken cough drops. Pt denies knowing of covid exposure.

## 2020-10-25 NOTE — ED Provider Notes (Signed)
[facility]    CSN: [id number] Arrival date & time: 10/25/20  1051      History   Chief Complaint Chief Complaint  Patient presents with   Cough   Urinary Tract Infection   Nausea    HPI Olivia Thompson is a 24 y.o. female presenting today for evaluation of URI symptoms and possible UTI. Reports that over the past 3 to 4 days she has had cough congestion and loss of smell. She denies any fevers or close sick contacts. Denies chest pain or shortness of breath. Denies any nausea vomiting or abdominal pain. She also is concerned about possible UTI, reports urinary urgency, frequency and incomplete voiding over the past week. Denies dysuria. Denies any vaginal discharge itching or irritation. Reports being screened for STDs last week which was unremarkable. Last menstrual cycle approximately 2 weeks ago, not on birth control.  HPI  History reviewed. No pertinent past medical history.  There are no problems to display for this patient.   History reviewed. No pertinent surgical history.  OB History    Gravida  2   Para      Term      Preterm      AB      Living        SAB      TAB      Ectopic      Multiple      Live Births               Home Medications    Prior to Admission medications   Medication Sig Start Date End Date Taking? Authorizing Provider  acetaminophen (TYLENOL) 500 MG tablet Take 1 tablet (500 mg total) by mouth every 6 (six) hours as needed. 08/22/20   Fawze, Mina A, PA-C  benzonatate (TESSALON) 200 MG capsule Take 1 capsule (200 mg total) by mouth 3 (three) times daily as needed for up to 7 days for cough. 10/25/20 11/01/20  Denisa Enterline C, PA-C  fluticasone (FLONASE) 50 MCG/ACT nasal spray Place 1-2 sprays into both nostrils daily. 10/25/20   Mateya Torti C, PA-C  ibuprofen (ADVIL) 600 MG tablet Take 1 tablet (600 mg total) by mouth every 6 (six) hours as needed. 08/22/20   Fawze, Mina A, PA-C  lidocaine (XYLOCAINE) 2 %  solution Use as directed 20 mLs in the mouth or throat as needed for mouth pain. 09/16/20   Particia Nearing, PA-C  metroNIDAZOLE (FLAGYL) 500 MG tablet Take 1 tablet (500 mg total) by mouth 2 (two) times daily. One po bid x 7 days 06/25/19   Dartha Lodge, PA-C  nitrofurantoin, macrocrystal-monohydrate, (MACROBID) 100 MG capsule Take 1 capsule (100 mg total) by mouth 2 (two) times daily for 5 days. 10/25/20 10/30/20  Ralphie Lovelady C, PA-C  ondansetron (ZOFRAN ODT) 8 MG disintegrating tablet Take 1 tablet (8 mg total) by mouth every 8 (eight) hours as needed for nausea or vomiting. 09/16/20   Particia Nearing, PA-C  phenol (CHLORASEPTIC) 1.4 % LIQD Use as directed 1 spray in the mouth or throat as needed for throat irritation / pain. 08/22/20   Jeanie Sewer, PA-C    Family History History reviewed. No pertinent family history.  Social History Social History   Tobacco Use   Smoking status: Never Smoker   Smokeless tobacco: Never Used  Building services engineer Use: Never used  Substance Use Topics   Alcohol use: Never   Drug use: Never  Allergies   Amoxicillin   Review of Systems Review of Systems  Constitutional: Negative for activity change, appetite change, chills, fatigue and fever.  HENT: Positive for congestion and rhinorrhea. Negative for ear pain, sinus pressure, sore throat and trouble swallowing.   Eyes: Negative for discharge and redness.  Respiratory: Positive for cough. Negative for chest tightness and shortness of breath.   Cardiovascular: Negative for chest pain.  Gastrointestinal: Negative for abdominal pain, diarrhea, nausea and vomiting.  Genitourinary: Positive for frequency and urgency. Negative for dysuria, flank pain, genital sores, hematuria, menstrual problem, vaginal bleeding, vaginal discharge and vaginal pain.  Musculoskeletal: Negative for back pain and myalgias.  Skin: Negative for rash.  Neurological: Negative for dizziness,  light-headedness and headaches.     Physical Exam Triage Vital Signs ED Triage Vitals  Enc Vitals Group     BP 10/25/20 1223 94/73     Pulse Rate 10/25/20 1223 (!) 106     Resp 10/25/20 1223 20     Temp 10/25/20 1223 99.5 F (37.5 C)     Temp Source 10/25/20 1223 Oral     SpO2 10/25/20 1223 99 %     Weight --      Height --      Head Circumference --      Peak Flow --      Pain Score 10/25/20 1218 0     Pain Loc --      Pain Edu? --      Excl. in GC? --    No data found.  Updated Vital Signs BP 94/73 (BP Location: Left Arm)    Pulse (!) 106    Temp 99.5 F (37.5 C) (Oral)    Resp 20    LMP 10/06/2020 (Exact Date)    SpO2 99%   Visual Acuity Right Eye Distance:   Left Eye Distance:   Bilateral Distance:    Right Eye Near:   Left Eye Near:    Bilateral Near:     Physical Exam Vitals and nursing note reviewed.  Constitutional:      Appearance: She is well-developed.     Comments: No acute distress  HENT:     Head: Normocephalic and atraumatic.     Ears:     Comments: Bilateral ears without tenderness to palpation of external auricle, tragus and mastoid, EAC's without erythema or swelling, TM's with good bony landmarks and cone of light. Non erythematous.     Nose: Nose normal.     Mouth/Throat:     Comments: Oral mucosa pink and moist, no tonsillar enlargement or exudate. Posterior pharynx patent and nonerythematous, no uvula deviation or swelling. Normal phonation. Eyes:     Conjunctiva/sclera: Conjunctivae normal.  Cardiovascular:     Rate and Rhythm: Normal rate.  Pulmonary:     Effort: Pulmonary effort is normal. No respiratory distress.     Comments: Breathing comfortably at rest, CTABL, no wheezing, rales or other adventitious sounds auscultated Abdominal:     General: There is no distension.  Musculoskeletal:        General: Normal range of motion.     Cervical back: Neck supple.  Skin:    General: Skin is warm and dry.  Neurological:     Mental  Status: She is alert and oriented to person, place, and time.      UC Treatments / Results  Labs (all labs ordered are listed, but only abnormal results are displayed) Labs Reviewed  POCT URINALYSIS DIPSTICK, ED /  UC - Abnormal; Notable for the following components:      Result Value   Ketones, ur 15 (*)    Hgb urine dipstick SMALL (*)    Leukocytes,Ua SMALL (*)    All other components within normal limits  SARS CORONAVIRUS 2 (TAT 6-24 HRS)  URINE CULTURE  POC URINE PREG, ED    EKG   Radiology No results found.  Procedures Procedures (including critical care time)  Medications Ordered in UC Medications - No data to display  Initial Impression / Assessment and Plan / UC Course  I have reviewed the triage vital signs and the nursing notes.  Pertinent labs & imaging results that were available during my care of the patient were reviewed by me and considered in my medical decision making (see chart for details).     1.  Viral URI with cough-exam reassuring, Covid test pending, recommending symptomatic and supportive care with close monitoring.  2.  UTI-small leuks on UA, urine culture pending.  Initiating on Macrobid twice daily x5 days.  Push fluids.  Discussed strict return precautions. Patient verbalized understanding and is agreeable with plan.  Final Clinical Impressions(s) / UC Diagnoses   Final diagnoses:  Viral URI with cough  Lower urinary tract infectious disease     Discharge Instructions     Covid test pending Flonase to help with nasal congestion Tessalon every 8 hours for cough  Urine did show signs of infection Begin Macrobid twice daily for 5 days Push fluids  Follow-up if not improving or worsening    ED Prescriptions    Medication Sig Dispense Auth. Provider   nitrofurantoin, macrocrystal-monohydrate, (MACROBID) 100 MG capsule Take 1 capsule (100 mg total) by mouth 2 (two) times daily for 5 days. 10 capsule Tatjana Turcott C, PA-C    benzonatate (TESSALON) 200 MG capsule Take 1 capsule (200 mg total) by mouth 3 (three) times daily as needed for up to 7 days for cough. 28 capsule Yezenia Fredrick C, PA-C   fluticasone (FLONASE) 50 MCG/ACT nasal spray Place 1-2 sprays into both nostrils daily. 16 g Castulo Scarpelli, [place] C, PA-C     PDMP not reviewed this encounter.   Lew Dawes, [place] 10/25/20 1339

## 2020-10-26 ENCOUNTER — Telehealth: Payer: Self-pay | Admitting: Nurse Practitioner

## 2020-10-26 LAB — URINE CULTURE: Culture: <10000 — AB

## 2020-10-26 NOTE — Telephone Encounter (Signed)
Called to Discuss with patient about Covid symptoms and the use of the monoclonal antibody infusion for those with mild to moderate Covid symptoms and at a high risk of hospitalization.     Pt appears to qualify for this infusion due to co-morbid conditions and/or a member of an at-risk group in accordance with the FDA Emergency Use Authorization. (BMI >25, SVI high). Per ED notes symptoms started aprox 3-4 days ago.    Unable to reach pt. Voicemail left and My Chart message sent.   Willette Alma, NP [facility]  [phone]

## 2022-06-21 ENCOUNTER — Other Ambulatory Visit: Payer: Self-pay

## 2022-06-21 ENCOUNTER — Encounter (HOSPITAL_COMMUNITY): Payer: Self-pay | Admitting: Emergency Medicine

## 2022-06-21 ENCOUNTER — Ambulatory Visit (HOSPITAL_COMMUNITY)
Admission: EM | Admit: 2022-06-21 | Discharge: 2022-06-21 | Disposition: A | Discharge status: Home / Self Care | Payer: BC Managed Care – PPO

## 2022-06-21 DIAGNOSIS — J029 Acute pharyngitis, unspecified: Secondary | ICD-10-CM

## 2022-06-21 DIAGNOSIS — J020 Streptococcal pharyngitis: Secondary | ICD-10-CM | POA: Diagnosis not present

## 2022-06-21 HISTORY — DX: Attention-deficit hyperactivity disorder, unspecified type: F90.9

## 2022-06-21 HISTORY — DX: Anxiety disorder, unspecified: F41.9

## 2022-06-21 LAB — POCT RAPID STREP A, ED / UC: Streptococcus, Group A Screen (Direct): POSITIVE — AB

## 2022-06-21 MED ORDER — PENICILLIN G BENZATHINE 1200000 UNIT/2ML IM SUSY
PREFILLED_SYRINGE | INTRAMUSCULAR | Status: AC
  Filled 2022-06-21: qty 2

## 2022-06-21 MED ORDER — PENICILLIN G BENZATHINE 1200000 UNIT/2ML IM SUSY
2.4000 10*6.[IU] | PREFILLED_SYRINGE | Freq: Once | INTRAMUSCULAR | Status: AC
  Administered 2022-06-21: 2.4 10*6.[IU] via INTRAMUSCULAR

## 2022-06-21 MED ORDER — IBUPROFEN 100 MG/5ML PO SUSP
600.0000 mg | Freq: Once | ORAL | Status: AC
  Administered 2022-06-21: 600 mg via ORAL

## 2022-06-21 MED ORDER — IBUPROFEN 100 MG/5ML PO SUSP
ORAL | Status: AC
  Filled 2022-06-21: qty 30

## 2022-06-21 NOTE — Discharge Instructions (Signed)
You tested positive for strep.  We gave you an injection of penicillin to treat this in clinic today.  Gargle with warm salt water and alternate Tylenol and ibuprofen.  You should feel much better within 24 hours.  Make sure to dispose of her toothbrush within the next few days to prevent reinfection.  You are contagious for 24 hours.  If you have any difficulty swallowing, fever not responding to medication, chest pain, shortness of breath, nausea/vomiting, muffled voice you need to go to the emergency room immediately.

## 2022-06-21 NOTE — ED Triage Notes (Signed)
Patient c/o sore throat x 2 days.   Patient endorses having a fever at home, patient is unaware of temperature.   Patient has difficulty swallowing/ Patient is currently spitting up saliva.   Patient endorses RT leg aching that started when the sore throat began.   Patient hasn't taken any medications for symptoms.

## 2022-06-21 NOTE — ED Provider Notes (Signed)
[facility]    CSN: [id number] Arrival date & time: 06/21/22  1129      History   Chief Complaint Chief Complaint  Patient presents with   Sore Throat    HPI Olivia Thompson is a 26 y.o. female.   Patient is today with a 2-day history of sore throat.  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  She reports associated fever and body aches but denies additional symptoms including cough or congestion.  She is confident she is not pregnant.  She has not been taking any over-the-counter medication for symptom management.  Denies any recent antibiotic or steroid use.  She is having difficulty swallowing due to severity of pain but can swallow if necessary.      Past Medical History:  Diagnosis Date   ADHD    Anxiety     There are no problems to display for this patient.   History reviewed. No pertinent surgical history.  OB History     Gravida  2   Para      Term      Preterm      AB      Living         SAB      IAB      Ectopic      Multiple      Live Births               Home Medications    Prior to Admission medications   Medication Sig Start Date End Date Taking? Authorizing Provider  VYVANSE 30 MG capsule Take 30 mg by mouth daily as needed. 06/20/22  Yes [provider]  FLUoxetine (PROZAC) 40 MG capsule Take 40 mg by mouth every morning. 05/08/22   [provider]    Family History History reviewed. No pertinent family history.  Social History Social History   Tobacco Use   Smoking status: Never   Smokeless tobacco: Never  Vaping Use   Vaping Use: Never used  Substance Use Topics   Alcohol use: Never   Drug use: Yes    Types: Marijuana     Allergies   Amoxicillin   Review of Systems Review of Systems  Constitutional:  Positive for activity change and fever. Negative for  appetite change and fatigue.  HENT:  Positive for sore throat and trouble swallowing. Negative for congestion, sinus pressure, sneezing and voice change.   Respiratory:  Negative for cough and shortness of breath.   Cardiovascular:  Negative for chest pain.  Gastrointestinal:  Negative for abdominal pain, diarrhea, nausea and vomiting.  Neurological:  Negative for dizziness, light-headedness and headaches.     Physical Exam Triage Vital Signs ED Triage Vitals  Enc Vitals Group     BP 06/21/22 1242 124/72     Pulse Rate 06/21/22 1242 (!) 123     Resp 06/21/22 1242 18     Temp 06/21/22 1242 100 F (37.8 C)     Temp Source 06/21/22 1242 Oral     SpO2 06/21/22 1242 98 %     Weight --      Height --      Head Circumference --      Peak Flow --      Pain Score 06/21/22 1246 10     Pain Loc --      Pain Edu? --  Excl. in GC? --    No data found.  Updated Vital Signs BP 124/72 (BP Location: Left Arm)   Pulse (!) 101   Temp 100.2 F (37.9 C) (Oral)   Resp 16   LMP 05/31/2022   SpO2 100%   Breastfeeding No   Visual Acuity Right Eye Distance:   Left Eye Distance:   Bilateral Distance:    Right Eye Near:   Left Eye Near:    Bilateral Near:     Physical Exam Vitals reviewed.  Constitutional:      General: She is awake. She is not in acute distress.    Appearance: Normal appearance. She is well-developed. She is not ill-appearing.     Comments: Very pleasant female appears stated age in no acute distress  HENT:     Head: Normocephalic and atraumatic.     Right Ear: Tympanic membrane, ear canal and external ear normal. Tympanic membrane is not erythematous or bulging.     Left Ear: Tympanic membrane, ear canal and external ear normal. Tympanic membrane is not erythematous or bulging.     Nose:     Right Sinus: No maxillary sinus tenderness or frontal sinus tenderness.     Left Sinus: No maxillary sinus tenderness or frontal sinus tenderness.     Mouth/Throat:      Pharynx: Uvula midline. Posterior oropharyngeal erythema present. No oropharyngeal exudate.     Tonsils: Tonsillar exudate present. 2+ on the right. 2+ on the left.  Cardiovascular:     Rate and Rhythm: Regular rhythm. Tachycardia present.     Heart sounds: Normal heart sounds, S1 normal and S2 normal. No murmur heard. Pulmonary:     Effort: Pulmonary effort is normal.     Breath sounds: Normal breath sounds. No wheezing, rhonchi or rales.  Lymphadenopathy:     Head:     Right side of head: No submental, submandibular or tonsillar adenopathy.     Left side of head: No submental, submandibular or tonsillar adenopathy.     Cervical: No cervical adenopathy.  Psychiatric:        Behavior: Behavior is cooperative.      UC Treatments / Results  Labs (all labs ordered are listed, but only abnormal results are displayed) Labs Reviewed  POCT RAPID STREP A, ED / UC - Abnormal; Notable for the following components:      Result Value   Streptococcus, Group A Screen (Direct) POSITIVE (*)    All other components within normal limits    EKG   Radiology No results found.  Procedures Procedures (including critical care time)  Medications Ordered in UC Medications  ibuprofen (ADVIL) 100 MG/5ML suspension 600 mg (600 mg Oral Given 06/21/22 1318)  penicillin g benzathine (BICILLIN LA) 1200000 UNIT/2ML injection 2.4 Million Units (2.4 Million Units Intramuscular Given 06/21/22 1318)    Initial Impression / Assessment and Plan / UC Course  I have reviewed the triage vital signs and the nursing notes.  Pertinent labs & imaging results that were available during my care of the patient were reviewed by me and considered in my medical decision making (see chart for details).     Patient tested positive for strep pharyngitis.  She was given ibuprofen in clinic with improvement of pain and tachycardia which was initially noted on intake.  She does have an allergy to amoxicillin which is listed  as yeast infection.  She has safely taken penicillin in the past and done well with this medication.  She  was given 2,400,000 units of penicillin in clinic to treat strep pharyngitis.  Recommended gargle with warm salt water and alternate Tylenol ibuprofen at home for fever and pain.  Discussed that she should dispose of her toothbrush few days after starting medication to prevent reinfection.  Strict return precautions given to which she expressed understanding.  Work excuse note provided.  Final Clinical Impressions(s) / UC Diagnoses   Final diagnoses:  Strep pharyngitis  Sore throat     Discharge Instructions      You tested positive for strep.  We gave you an injection of penicillin to treat this in clinic today.  Gargle with warm salt water and alternate Tylenol and ibuprofen.  You should feel much better within 24 hours.  Make sure to dispose of her toothbrush within the next few days to prevent reinfection.  You are contagious for 24 hours.  If you have any difficulty swallowing, fever not responding to medication, chest pain, shortness of breath, nausea/vomiting, muffled voice you need to go to the emergency room immediately.     ED Prescriptions   None    PDMP not reviewed this encounter.   Jeani Hawking, PA-C 06/21/22 1401

## 2022-11-19 ENCOUNTER — Emergency Department (HOSPITAL_COMMUNITY)
Admission: EM | Admit: 2022-11-19 | Discharge: 2022-11-19 | Disposition: A | Discharge status: Home / Self Care | Payer: BC Managed Care – PPO | Attending: Emergency Medicine | Admitting: Emergency Medicine

## 2022-11-19 ENCOUNTER — Encounter (HOSPITAL_COMMUNITY): Payer: Self-pay | Admitting: Emergency Medicine

## 2022-11-19 DIAGNOSIS — N390 Urinary tract infection, site not specified: Secondary | ICD-10-CM | POA: Insufficient documentation

## 2022-11-19 DIAGNOSIS — Z1152 Encounter for screening for COVID-19: Secondary | ICD-10-CM | POA: Insufficient documentation

## 2022-11-19 DIAGNOSIS — I10 Essential (primary) hypertension: Secondary | ICD-10-CM | POA: Insufficient documentation

## 2022-11-19 DIAGNOSIS — J029 Acute pharyngitis, unspecified: Secondary | ICD-10-CM | POA: Insufficient documentation

## 2022-11-19 LAB — URINALYSIS, ROUTINE W REFLEX MICROSCOPIC
Bilirubin Urine: NEGATIVE
Glucose, UA: NEGATIVE mg/dL
Ketones, ur: 80 mg/dL — AB
Nitrite: NEGATIVE
Protein, ur: 30 mg/dL — AB
Specific Gravity, Urine: 1.028 (ref 1.005–1.030)
WBC, UA: >50 WBC/hpf — ABNORMAL HIGH (ref 0–5)
pH: 5 (ref 5.0–8.0)

## 2022-11-19 LAB — PREGNANCY, URINE: Preg Test, Ur: NEGATIVE

## 2022-11-19 LAB — RESP PANEL BY RT-PCR (FLU A&B, COVID) ARPGX2
Influenza A by PCR: NEGATIVE
Influenza B by PCR: NEGATIVE
SARS Coronavirus 2 by RT PCR: NEGATIVE

## 2022-11-19 LAB — GROUP A STREP BY PCR: Group A Strep by PCR: NOT DETECTED

## 2022-11-19 MED ORDER — CEPHALEXIN 250 MG PO CAPS
500.0000 mg | ORAL_CAPSULE | Freq: Once | ORAL | Status: AC
  Administered 2022-11-19: 500 mg via ORAL
  Filled 2022-11-19: qty 2

## 2022-11-19 MED ORDER — CEPHALEXIN 500 MG PO CAPS: 500.0000 mg | ORAL_CAPSULE | Freq: Two times a day (BID) | ORAL | 0 refills | Status: AC

## 2022-11-19 NOTE — ED Provider Notes (Signed)
[facility] EMERGENCY DEPARTMENT Provider Note   CSN: [id number] Arrival date & time: 11/19/22  0011     History  Chief Complaint  Patient presents with   Urinary Frequency   Sore Throat    Olivia Thompson is a 26 y.o. female who states she has had 4 days of burning with urination urinary frequency and urinary urgency.  No hematuria fevers or chills.  Additionally has a few days of sore throat and fullness in the front of her neck.  No neck stiffness or headache.  Patient states she frequently has a strep, most recently in June of this year.  No known sick contacts.  History of HTN and anxiety.  HPI     Home Medications Prior to Admission medications   Medication Sig Start Date End Date Taking? Authorizing Provider  cephALEXin (KEFLEX) 500 MG capsule Take 1 capsule (500 mg total) by mouth 2 (two) times daily for 7 days. 11/19/22 11/26/22 Yes Marquan Vokes, Gypsy Balsam, PA-C  FLUoxetine (PROZAC) 40 MG capsule Take 40 mg by mouth every morning. 05/08/22   [provider]  VYVANSE 30 MG capsule Take 30 mg by mouth daily as needed. 06/20/22   [provider]      Allergies    Amoxicillin    Review of Systems   Review of Systems  HENT:  Positive for sore throat. Negative for congestion, dental problem and ear discharge.   Respiratory:  Negative for cough and shortness of breath.   Genitourinary:  Positive for dysuria, frequency and urgency. Negative for decreased urine volume, flank pain, hematuria, vaginal bleeding, vaginal discharge and vaginal pain.  Musculoskeletal:  Negative for neck pain and neck stiffness.    Physical Exam Updated Vital Signs BP 126/70 (BP Location: Right Arm)   Pulse (!) 105   Temp 98.2 F (36.8 C)   Resp 18   LMP 11/07/2022   SpO2 100%  Physical Exam Vitals and nursing note reviewed.  Constitutional:      Appearance: She is not ill-appearing or toxic-appearing.  HENT:     Head: Normocephalic and atraumatic.      Nose: Nose normal.     Mouth/Throat:     Mouth: Mucous membranes are moist.     Pharynx: Oropharynx is clear. Posterior oropharyngeal erythema present. No oropharyngeal exudate.     Tonsils: Tonsillar exudate present. 2+ on the right. 2+ on the left.  Eyes:     General:        Right eye: No discharge.        Left eye: No discharge.     Conjunctiva/sclera: Conjunctivae normal.     Pupils: Pupils are equal, round, and reactive to light.  Cardiovascular:     Rate and Rhythm: Normal rate and regular rhythm.     Pulses: Normal pulses.  Pulmonary:     Effort: Pulmonary effort is normal. No respiratory distress.     Breath sounds: Normal breath sounds. No wheezing or rales.  Abdominal:     General: Bowel sounds are normal. There is no distension.     Tenderness: There is no abdominal tenderness.  Musculoskeletal:        General: No deformity.     Cervical back: Normal range of motion and neck supple.  Skin:    General: Skin is warm and dry.     Capillary Refill: Capillary refill takes less than 2 seconds.  Neurological:     Mental Status: She is alert and oriented to person,  place, and time. Mental status is at baseline.  Psychiatric:        Mood and Affect: Mood normal.     ED Results / Procedures / Treatments   Labs (all labs ordered are listed, but only abnormal results are displayed) Labs Reviewed  URINALYSIS, ROUTINE W REFLEX MICROSCOPIC - Abnormal; Notable for the following components:      Result Value   APPearance CLOUDY (*)    Hgb urine dipstick LARGE (*)    Ketones, ur 80 (*)    Protein, ur 30 (*)    Leukocytes,Ua MODERATE (*)    WBC, UA >50 (*)    Bacteria, UA FEW (*)    Non Squamous Epithelial 0-5 (*)    All other components within normal limits  GROUP A STREP BY PCR  RESP PANEL BY RT-PCR (FLU A&B, COVID) ARPGX2  PREGNANCY, URINE    EKG None  Radiology No results found.  Procedures Procedures    Medications Ordered in ED Medications  cephALEXin  (KEFLEX) capsule 500 mg (500 mg Oral Given 11/19/22 0410)    ED Course/ Medical Decision Making/ A&P                           Medical Decision Making 26-year female presents with urinary symptoms and sore throat.  Tachycardic on intake, at time my evaluation tachycardia is resolved.  Cardiopulmonary exam is unremarkable, abdominal exam is benign.  HEENT exam as above with bilateral tonsillar edema and scant exudate, shotty anterior cervical lymphadenopathy bilaterally.  Differential notes includes not limited to pregnancy, UTI, kidney stone.  Regarding her sore throat differential diagnosis includes acute viral pharyngitis, strep pharyngitis, tonsillitis, PTA, RPA.  Amount and/or Complexity of Data Reviewed Labs: ordered.    Details: Strep is negative, RVP negative.  UA with evidence of infection With large hemoglobin, ketonuria proteinuria with moderate leukocytes greater than 50 WBCs and bacteria.   Clinical picture most consistent with acute viral pharyngitis and UTI.  Will treat with oral antibiotics.  No concern for emergent underlying etiology or further ED workup or inpatient management is exceedingly low.  Cortasia voiced understanding of her medical evaluation and treatment plan. Each of their questions answered to their expressed satisfaction.  Return precautions were given.  Patient is well-appearing, stable, and was discharged in good condition.  This chart was dictated using voice recognition software, Dragon. Despite the best efforts of this provider to proofread and correct errors, errors may still occur which can change documentation meaning.  Final Clinical Impression(s) / ED Diagnoses Final diagnoses:  Lower urinary tract infectious disease  Sore throat    Rx / DC Orders ED Discharge Orders          Ordered    cephALEXin (KEFLEX) 500 MG capsule  2 times daily        11/19/22 0411              Alim Cattell R, PA-C 11/19/22 0507    Sloan Leiter,  DO 11/20/22 [phone]

## 2022-11-19 NOTE — Discharge Instructions (Signed)
You were seen in the ER today for your urinary symptoms and sore throat. You have a UTI. You tested negative for strep today. You have been started on antibiotics for your UTI. Please take these as prescribed for the entire course. Return to the ER with any new severe symptoms.

## 2022-11-19 NOTE — ED Provider Triage Note (Signed)
Emergency Medicine Provider Triage Evaluation Note  Olivia Thompson , a 26 y.o. female  was evaluated in triage.  Pt complains of urinary frequency, urgency and dysuria x 24 hours additionally a sore throat with pain with swallowing but no difficulty swallowing x 24 hours, history of recurrent strep..  Review of Systems  Positive: As above Negative: Fevers chills nausea vomiting diarrhea  Physical Exam  BP 98/78 (BP Location: Right Arm)   Pulse (!) 120   Temp 98.8 F (37.1 C)   Resp 18   LMP 11/07/2022   SpO2 99%  Gen:   Awake, no distress   Resp:  Normal effort  MSK:   Moves extremities without difficulty  Other:  RRR no M/R/G.  Tonsils with erythema and edema bilaterally, scant exudate on the left greater than right.  Shotty anterior cervical lymphadenopathy as well.  No meningeal signs.  Medical Decision Making  Medically screening exam initiated at 1:14 AM.  Appropriate orders placed.  Taliana Mersereau was informed that the remainder of the evaluation will be completed by another provider, this initial triage assessment does not replace that evaluation, and the importance of remaining in the ED until their evaluation is complete.  This chart was dictated using voice recognition software, Dragon. Despite the best efforts of this provider to proofread and correct errors, errors may still occur which can change documentation meaning.    Paris Lore, PA-C 11/19/22 [phone]

## 2022-11-19 NOTE — ED Triage Notes (Signed)
Pt reports burning sensation and urinary frequency. This started last week. SHe took AZO and helped but symptoms have returned. She reports also having throat pain and neck stiffness. She reports it is a yearly occurrence for her and she feels like these are similar symptoms.

## 2023-06-01 ENCOUNTER — Emergency Department (HOSPITAL_COMMUNITY)
Admission: EM | Admit: 2023-06-01 | Discharge: 2023-06-02 | Disposition: A | Discharge status: Home / Self Care | Payer: Medicaid Other | Attending: Emergency Medicine | Admitting: Emergency Medicine

## 2023-06-01 DIAGNOSIS — Y92410 Unspecified street and highway as the place of occurrence of the external cause: Secondary | ICD-10-CM | POA: Diagnosis not present

## 2023-06-01 DIAGNOSIS — M25561 Pain in right knee: Secondary | ICD-10-CM | POA: Insufficient documentation

## 2023-06-01 DIAGNOSIS — R079 Chest pain, unspecified: Secondary | ICD-10-CM | POA: Diagnosis not present
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
## 2023-06-02 ENCOUNTER — Encounter (HOSPITAL_COMMUNITY): Payer: Self-pay

## 2023-06-02 ENCOUNTER — Other Ambulatory Visit: Payer: Self-pay

## 2023-06-02 ENCOUNTER — Emergency Department (HOSPITAL_COMMUNITY): Payer: Medicaid Other

## 2023-06-02 LAB — PREGNANCY, URINE: Preg Test, Ur: NEGATIVE

## 2023-06-02 MED ORDER — MELOXICAM 15 MG PO TABS: 15.0000 mg | ORAL_TABLET | Freq: Every day | ORAL | 0 refills | Status: DC

## 2023-06-02 MED ORDER — KETOROLAC TROMETHAMINE 60 MG/2ML IM SOLN
30.0000 mg | Freq: Once | INTRAMUSCULAR | Status: AC
  Administered 2023-06-02: 30 mg via INTRAMUSCULAR
  Filled 2023-06-02: qty 2

## 2023-06-02 MED ORDER — CYCLOBENZAPRINE HCL 10 MG PO TABS: 10.0000 mg | ORAL_TABLET | Freq: Two times a day (BID) | ORAL | 0 refills | Status: DC | PRN
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
## 2023-11-18 ENCOUNTER — Encounter: Payer: Self-pay | Admitting: Radiology

## 2023-11-18 ENCOUNTER — Other Ambulatory Visit (HOSPITAL_COMMUNITY)
Admission: RE | Admit: 2023-11-18 | Discharge: 2023-11-18 | Disposition: A | Discharge status: Home / Self Care | Payer: Medicaid Other | Source: Ambulatory Visit | Attending: Radiology | Admitting: Radiology

## 2023-11-18 ENCOUNTER — Ambulatory Visit (INDEPENDENT_AMBULATORY_CARE_PROVIDER_SITE_OTHER): Payer: Medicaid Other | Admitting: Radiology

## 2023-11-18 VITALS — BP 124/80 | HR 90 | Ht 60.25 in | Wt 205.0 lb

## 2023-11-18 DIAGNOSIS — Z01419 Encounter for gynecological examination (general) (routine) without abnormal findings: Secondary | ICD-10-CM | POA: Diagnosis not present

## 2023-11-18 DIAGNOSIS — Z789 Other specified health status: Secondary | ICD-10-CM | POA: Diagnosis not present

## 2023-11-18 DIAGNOSIS — B9689 Other specified bacterial agents as the cause of diseases classified elsewhere: Secondary | ICD-10-CM

## 2023-11-18 DIAGNOSIS — Z113 Encounter for screening for infections with a predominantly sexual mode of transmission: Secondary | ICD-10-CM

## 2023-11-18 DIAGNOSIS — N760 Acute vaginitis: Secondary | ICD-10-CM | POA: Diagnosis not present

## 2023-11-18 NOTE — Progress Notes (Signed)
Olivia Thompson 08-17-1996 [id number]   History:  27 y.o. G1P1 presents for annual exam. Trying for pregnancy. Has not starting PNV. Periods are regular. Has a PCP. Would like STI screen, partner was unfaithful recently.   Gynecologic History Patient's last menstrual period was 11/11/2023 (exact date). Period Cycle (Days): 28 Period Duration (Days): 4 Period Pattern: Regular Menstrual Flow: Moderate Menstrual Control: Tampon Dysmenorrhea: (!) Moderate Dysmenorrhea Symptoms: Cramping Contraception/Family planning: none Sexually active: yes Last Pap: ?2022. Results were: normal   Obstetric History OB History  Gravida Para Term Preterm AB Living  1 1       1   SAB IAB Ectopic Multiple Live Births          1    # Outcome Date GA Lbr Len/2nd Weight Sex Type Anes PTL Lv  1 Para             The following portions of the patient's history were reviewed and updated as appropriate: allergies, current medications, past family history, past medical history, past social history, past surgical history, and problem list.  Review of Systems  All other systems reviewed and are negative.   Past medical history, past surgical history, family history and social history were all reviewed and documented in the EPIC chart.  Exam:  Vitals:   11/18/23 1058  BP: 124/80  Pulse: 90  SpO2: 96%  Weight: 205 lb (93 kg)  Height: 5' 0.25" (1.53 m)   Body mass index is 39.7 kg/m.  Physical Exam Vitals and nursing note reviewed. Exam conducted with a chaperone present.  Constitutional:      Appearance: Normal appearance. She is normal weight.  HENT:     Head: Normocephalic and atraumatic.  Neck:     Thyroid: No thyroid mass, thyromegaly or thyroid tenderness.  Cardiovascular:     Rate and Rhythm: Regular rhythm.     Heart sounds: Normal heart sounds.  Pulmonary:     Effort: Pulmonary effort is normal.     Breath sounds: Normal breath sounds.  Chest:  Breasts:    Breasts are symmetrical.      Right: Normal. No inverted nipple, mass, nipple discharge, skin change or tenderness.     Left: Normal. No inverted nipple, mass, nipple discharge, skin change or tenderness.  Abdominal:     General: Abdomen is flat. Bowel sounds are normal.     Palpations: Abdomen is soft.  Genitourinary:    General: Normal vulva.     Vagina: Normal. No vaginal discharge, bleeding or lesions.     Cervix: Normal. No discharge or lesion.     Uterus: Normal. Not enlarged and not tender.      Adnexa: Right adnexa normal and left adnexa normal.       Right: No mass, tenderness or fullness.         Left: No mass, tenderness or fullness.    Lymphadenopathy:     Upper Body:     Right upper body: No axillary adenopathy.     Left upper body: No axillary adenopathy.  Skin:    General: Skin is warm and dry.  Neurological:     Mental Status: She is alert and oriented to person, place, and time.  Psychiatric:        Mood and Affect: Mood normal.        Thought Content: Thought content normal.        Judgment: Judgment normal.      Raynelle Fanning, CMA present for exam  Assessment/Plan:   1. Well woman exam with routine gynecological exam - Cytology - PAP( [place])  2. Attempting to conceive Begin PNV daily Cycles are regular  3. Screening for STDs (sexually transmitted diseases) Hx of trichomoniasis, partner recently unfaithful Declines STD serology, plans [facility]      Arlie Solomons B WHNP-BC 11:26 AM 11/18/2023

## 2023-11-19 LAB — CYTOLOGY - PAP
Adequacy: ABSENT
Chlamydia: NEGATIVE
Comment: NEGATIVE
Comment: NEGATIVE
Comment: NORMAL
Diagnosis: NEGATIVE
Neisseria Gonorrhea: NEGATIVE
Trichomonas: NEGATIVE

## 2023-11-20 MED ORDER — NUVESSA 1.3 % VA GEL
1.0000 | Freq: Once | VAGINAL | 0 refills | Status: AC
Start: 2023-11-20 — End: 2023-11-20

## 2023-11-20 NOTE — Addendum Note (Signed)
Addended by: Arlie Solomons on: 11/20/2023 08:10 AM   Modules accepted: Orders

## 2023-12-16 ENCOUNTER — Encounter: Payer: Self-pay | Admitting: Family Medicine

## 2023-12-16 ENCOUNTER — Other Ambulatory Visit: Payer: Self-pay | Admitting: Family Medicine

## 2023-12-16 ENCOUNTER — Encounter (HOSPITAL_COMMUNITY): Payer: Self-pay | Admitting: *Deleted

## 2023-12-16 ENCOUNTER — Inpatient Hospital Stay (HOSPITAL_COMMUNITY)
Admission: AD | Admit: 2023-12-16 | Discharge: 2023-12-16 | Disposition: A | Discharge status: Home / Self Care | Payer: Medicaid Other | Attending: Family Medicine | Admitting: Family Medicine

## 2023-12-16 DIAGNOSIS — N912 Amenorrhea, unspecified: Secondary | ICD-10-CM | POA: Diagnosis not present

## 2023-12-16 DIAGNOSIS — N930 Postcoital and contact bleeding: Secondary | ICD-10-CM | POA: Insufficient documentation

## 2023-12-16 DIAGNOSIS — O209 Hemorrhage in early pregnancy, unspecified: Secondary | ICD-10-CM | POA: Insufficient documentation

## 2023-12-16 DIAGNOSIS — Z3A01 Less than 8 weeks gestation of pregnancy: Secondary | ICD-10-CM | POA: Diagnosis not present

## 2023-12-16 DIAGNOSIS — Z3490 Encounter for supervision of normal pregnancy, unspecified, unspecified trimester: Secondary | ICD-10-CM

## 2023-12-16 LAB — URINALYSIS, ROUTINE W REFLEX MICROSCOPIC
Bilirubin Urine: NEGATIVE
Glucose, UA: NEGATIVE mg/dL
Ketones, ur: NEGATIVE mg/dL
Nitrite: NEGATIVE
Protein, ur: NEGATIVE mg/dL
Specific Gravity, Urine: 1.008 (ref 1.005–1.030)
pH: 6 (ref 5.0–8.0)

## 2023-12-16 LAB — CBC
HCT: 35.1 % — ABNORMAL LOW (ref 36.0–46.0)
Hemoglobin: 11.4 g/dL — ABNORMAL LOW (ref 12.0–15.0)
MCH: 28.9 pg (ref 26.0–34.0)
MCHC: 32.5 g/dL (ref 30.0–36.0)
MCV: 88.9 fL (ref 80.0–100.0)
Platelets: 407 10*3/uL — ABNORMAL HIGH (ref 150–400)
RBC: 3.95 MIL/uL (ref 3.87–5.11)
RDW: 14.4 % (ref 11.5–15.5)
WBC: 13.4 10*3/uL — ABNORMAL HIGH (ref 4.0–10.5)
nRBC: 0 % (ref 0.0–0.2)

## 2023-12-16 LAB — POCT PREGNANCY, URINE: Preg Test, Ur: POSITIVE — AB

## 2023-12-16 LAB — HCG, QUANTITATIVE, PREGNANCY: hCG, Beta Chain, Quant, S: 81 m[IU]/mL — ABNORMAL HIGH (ref <5)

## 2023-12-16 MED ORDER — PREPLUS 27-1 MG PO TABS: 1.0000 | ORAL_TABLET | Freq: Every day | ORAL | 13 refills | Status: DC

## 2023-12-16 MED ORDER — PREPLUS 27-1 MG PO TABS: 1.0000 | ORAL_TABLET | Freq: Every day | ORAL | 13 refills | Status: AC

## 2023-12-16 NOTE — MAU Note (Addendum)
 Olivia Thompson is a 28 y.o. at Unknown here in MAU reporting: found out preg last Tues.  Started bleeding today. Spotting, just seen it when she wiped after using restroom- pinkish, no clots. Had sex this morning. LMP: 12/3 Onset of complaint: this morning Pain score: mild cramping Vitals:   12/16/23 1501  BP: 120/64  Pulse: 80  Resp: 17  Temp: 97.9 F (36.6 C)  SpO2: 100%      Lab orders placed from triage:  UPT, UA if +     Well woman check up 12/10pap smear and STI screen (neg)

## 2023-12-16 NOTE — MAU Provider Note (Signed)
 History     CSN: [id number]  Arrival date and time: 12/16/23 1407   Event Date/Time   First Provider Initiated Contact with Patient 12/16/23 1557      Chief Complaint  Patient presents with   Vaginal Bleeding   Abdominal Pain   Possible Pregnancy   Olivia Thompson is a 28 y.o. G2P1 at [redacted]w[redacted]d presenting with missed period, LMP 11/11/23 and reports having intercourse last night and this AM and noted to have pink discharge that has progressed to some dark red blood. No clots. Mild cramping. She was not using any contraceptive method.   Vaginal Bleeding The patient's primary symptoms include missed menses and vaginal bleeding. The patient's pertinent negatives include no genital itching or pelvic pain. This is a new problem. The current episode started today. The problem occurs 2 to 4 times per day. The problem has been unchanged. The pain is mild. The problem affects both sides. She is pregnant. Associated symptoms include abdominal pain. Pertinent negatives include no back pain, chills, diarrhea, dysuria, fever, flank pain, nausea, rash, sore throat or vomiting. The vaginal discharge was normal. The vaginal bleeding is lighter than menses. She has not been passing clots. She has not been passing tissue. The symptoms are aggravated by intercourse. She has tried nothing for the symptoms. She is sexually active (had three episodes of sex last night/today). She uses nothing for contraception. Her menstrual history has been regular. Her past medical history is significant for an STD (trichomonas in the past, patient recently tested negative).  Abdominal Pain Pertinent negatives include no diarrhea, dysuria, fever, nausea or vomiting.  Possible Pregnancy Associated symptoms include abdominal pain. Pertinent negatives include no chest pain, chills, congestion, coughing, fever, nausea, rash, sore throat or vomiting.    OB History     Gravida  2   Para  1   Term      Preterm      AB      Living  1       SAB      IAB      Ectopic      Multiple      Live Births  1           Past Medical History:  Diagnosis Date   ADHD    Anxiety     Past Surgical History:  Procedure Laterality Date   CESAREAN SECTION     PELVIC LAPAROSCOPY      Family History  Problem Relation Age of Onset   Breast cancer Mother    Colon cancer Maternal Grandmother        maglignant colon polyps   Throat cancer Maternal Grandfather     Social History   Tobacco Use   Smoking status: Former    Types: E-cigarettes    Passive exposure: Never   Smokeless tobacco: Never   Tobacco comments:    Stopped vaping  Vaping Use   Vaping status: Never Used  Substance Use Topics   Alcohol use: Yes    Comment: socially   Drug use: Yes    Types: Marijuana    Allergies:  Allergies  Allergen Reactions   Amoxicillin      Yeast infection    No medications prior to admission.    Review of Systems  Constitutional:  Negative for chills and fever.  HENT:  Negative for congestion and sore throat.   Eyes:  Negative for pain and visual disturbance.  Respiratory:  Negative for cough, chest tightness and shortness of  breath.   Cardiovascular:  Negative for chest pain.  Gastrointestinal:  Positive for abdominal pain. Negative for diarrhea, nausea and vomiting.  Endocrine: Negative for cold intolerance and heat intolerance.  Genitourinary:  Positive for missed menses and vaginal bleeding. Negative for dysuria, flank pain and pelvic pain.  Musculoskeletal:  Negative for back pain.  Skin:  Negative for rash.  Allergic/Immunologic: Negative for food allergies.  Neurological:  Negative for dizziness and light-headedness.  Psychiatric/Behavioral:  Negative for agitation.    Physical Exam   Blood pressure 120/64, pulse 80, temperature 97.9 F (36.6 C), temperature source Oral, resp. rate 17, height 5' (1.524 m), weight 91.8 kg, last menstrual period 11/11/2023, SpO2 100%.  Physical Exam Vitals and  nursing note reviewed.  Constitutional:      General: She is not in acute distress.    Appearance: She is well-developed.  HENT:     Head: Normocephalic and atraumatic.  Eyes:     General: No scleral icterus.    Conjunctiva/sclera: Conjunctivae normal.  Cardiovascular:     Rate and Rhythm: Normal rate.  Pulmonary:     Effort: Pulmonary effort is normal.  Chest:     Chest wall: No tenderness.  Abdominal:     Palpations: Abdomen is soft.     Tenderness: There is no abdominal tenderness. There is no guarding or rebound.  Genitourinary:    Vagina: Normal.  Musculoskeletal:        General: Normal range of motion.     Cervical back: Normal range of motion and neck supple.  Skin:    General: Skin is warm and dry.     Findings: No rash.  Neurological:     Mental Status: She is alert and oriented to person, place, and time.     MAU Course  Procedures  MDM: moderate  This patient presents to the ED for concern of   Chief Complaint  Patient presents with   Vaginal Bleeding   Abdominal Pain   Possible Pregnancy     These complains involves an extensive number of treatment options, and is a complaint that carries with it a high risk of complications and morbidity.  The differential diagnosis for  1.vaginal bleeding in early pregnancy INCLUDES threatened miscarriage, ectopic pregnancy (unless IUP confirmed), normal variant bleeding with live IUP. Most likely for this patient is normal variant/postcoital bleeding. Patient is approximately 2.[redacted] weeks pregnant.    Co morbidities that complicate the patient evaluation External records from outside source obtained and reviewed including Scanned media records, CareEverywhere, and Prenatal care records  Lab Tests: BHCG and Other ABO and CBC  I ordered, and personally interpreted labs.  The pertinent results include:   Results for orders placed or performed during the hospital encounter of 12/16/23 (from the past 24 hours)  Urinalysis,  Routine w reflex microscopic -Urine, Clean Catch     Status: Abnormal   Collection Time: 12/16/23  3:03 PM  Result Value Ref Range   Color, Urine STRAW (A) YELLOW   APPearance CLEAR CLEAR   Specific Gravity, Urine 1.008 1.005 - 1.030   pH 6.0 5.0 - 8.0   Glucose, UA NEGATIVE NEGATIVE mg/dL   Hgb urine dipstick LARGE (A) NEGATIVE   Bilirubin Urine NEGATIVE NEGATIVE   Ketones, ur NEGATIVE NEGATIVE mg/dL   Protein, ur NEGATIVE NEGATIVE mg/dL   Nitrite NEGATIVE NEGATIVE   Leukocytes,Ua TRACE (A) NEGATIVE   RBC / HPF 0-5 0 - 5 RBC/hpf   WBC, UA 0-5 0 - 5 WBC/hpf  Bacteria, UA RARE (A) NONE SEEN   Squamous Epithelial / HPF 0-5 0 - 5 /HPF   Mucus PRESENT   Pregnancy, urine POC     Status: Abnormal   Collection Time: 12/16/23  3:15 PM  Result Value Ref Range   Preg Test, Ur POSITIVE (A) NEGATIVE  CBC     Status: Abnormal   Collection Time: 12/16/23  4:25 PM  Result Value Ref Range   WBC 13.4 (H) 4.0 - 10.5 K/uL   RBC 3.95 3.87 - 5.11 MIL/uL   Hemoglobin 11.4 (L) 12.0 - 15.0 g/dL   HCT 64.8 (L) 63.9 - 53.9 %   MCV 88.9 80.0 - 100.0 fL   MCH 28.9 26.0 - 34.0 pg   MCHC 32.5 30.0 - 36.0 g/dL   RDW 85.5 88.4 - 84.4 %   Platelets 407 (H) 150 - 400 K/uL   nRBC 0.0 0.0 - 0.2 %  ABO/Rh     Status: None   Collection Time: 12/16/23  4:25 PM  Result Value Ref Range   ABO/RH(D)      O POS Performed at [facility], [address] [place], [place] [address]   hCG, quantitative, pregnancy     Status: Abnormal   Collection Time: 12/16/23  4:25 PM  Result Value Ref Range   hCG, Beta Chain, Quant, S 81 (H) <5 mIU/mL    Medicines ordered and prescription drug management:  Medications:    Reevaluation of the patient after these medicines showed that the patient improved I have reviewed the patients home medicines and have made adjustments as needed   MAU Course:  Discussed with patient performing stat beta-hCG and patient is able to leave the MAU and be called with any.  We  discussed that the best course of action at this point is to do serial beta-hCG and ultrasound likely to be helpful for evaluation.  Most likely cause of bleeding and postcoital bleeding.   After the interventions noted above, I reevaluated the patient and found that they have :improved  Dispostion: discharged   Assessment and Plan   1. Early stage of pregnancy   2. PCB (post coital bleeding)   3. Amenorrhea   4. [redacted] weeks gestation of pregnancy    - BHCG today and planned repeat in 48 hours at [facility] - Reviewed that if BHCG shows a IUP is expected I will order and US  at [facility] - Discussed return precautions in detail -- filling 1 pad per hour, dizziness, lightheadedness or other concern  Future Appointments  Date Time Provider Department Center  12/18/2023  2:35 PM [facility] FU [facility]  11/19/2024  8:00 AM Chrzanowski, Jami B, NP [facility] None    Suzen Octave [facility] 12/16/2023, 5:50 PM

## 2023-12-17 LAB — ABO/RH: ABO/RH(D): O POS

## 2023-12-18 ENCOUNTER — Other Ambulatory Visit: Payer: Self-pay

## 2023-12-18 ENCOUNTER — Ambulatory Visit: Payer: Medicaid Other | Admitting: General Practice

## 2023-12-18 VITALS — BP 134/86 | HR 98 | Ht 60.0 in | Wt 201.0 lb

## 2023-12-18 DIAGNOSIS — Z3A01 Less than 8 weeks gestation of pregnancy: Secondary | ICD-10-CM

## 2023-12-18 DIAGNOSIS — Z3490 Encounter for supervision of normal pregnancy, unspecified, unspecified trimester: Secondary | ICD-10-CM

## 2023-12-18 DIAGNOSIS — O3680X0 Pregnancy with inconclusive fetal viability, not applicable or unspecified: Secondary | ICD-10-CM

## 2023-12-18 LAB — BETA HCG QUANT (REF LAB): hCG Quant: 21 m[IU]/mL

## 2023-12-18 NOTE — Progress Notes (Signed)
 Beta HCG Follow-up Visit  Olivia Thompson presents to [facility] for follow-up beta HCG lab. She was seen in MAU for  spotting & mild cramping  on 1/7. Patient denies  pain or bleeding  today. Discussed with patient that we are following beta HCG levels today. Results will be back in approximately 2 hours. Valid contact number for patient confirmed. I will call the patient with results.   Beta HCG results:         1/7           81          1/9           21      Results and patient history reviewed with Dr Lola, who states decreasing bhcg levels are indicative of SAB. Patient should have repeat bhcg in 1 week. Patient called and informed of plan for follow-up. Scheduled lab appt for 1/16.  Elenor Mole 12/18/2023 3:03 PM

## 2023-12-25 ENCOUNTER — Other Ambulatory Visit: Payer: Self-pay

## 2023-12-25 ENCOUNTER — Other Ambulatory Visit: Payer: Medicaid Other

## 2023-12-25 DIAGNOSIS — Z3490 Encounter for supervision of normal pregnancy, unspecified, unspecified trimester: Secondary | ICD-10-CM

## 2023-12-26 LAB — BETA HCG QUANT (REF LAB): hCG Quant: <1 m[IU]/mL

## 2024-04-20 ENCOUNTER — Inpatient Hospital Stay (HOSPITAL_COMMUNITY)

## 2024-04-20 ENCOUNTER — Encounter (HOSPITAL_COMMUNITY): Payer: Self-pay | Admitting: Family Medicine

## 2024-04-20 ENCOUNTER — Inpatient Hospital Stay (HOSPITAL_COMMUNITY)
Admission: EM | Admit: 2024-04-20 | Discharge: 2024-04-20 | Disposition: A | Discharge status: Home / Self Care | Attending: Obstetrics and Gynecology | Admitting: Obstetrics and Gynecology

## 2024-04-20 DIAGNOSIS — B9689 Other specified bacterial agents as the cause of diseases classified elsewhere: Secondary | ICD-10-CM

## 2024-04-20 DIAGNOSIS — O26891 Other specified pregnancy related conditions, first trimester: Secondary | ICD-10-CM

## 2024-04-20 DIAGNOSIS — Z3A01 Less than 8 weeks gestation of pregnancy: Secondary | ICD-10-CM

## 2024-04-20 DIAGNOSIS — N760 Acute vaginitis: Secondary | ICD-10-CM

## 2024-04-20 DIAGNOSIS — O26851 Spotting complicating pregnancy, first trimester: Secondary | ICD-10-CM | POA: Diagnosis present

## 2024-04-20 DIAGNOSIS — R198 Other specified symptoms and signs involving the digestive system and abdomen: Secondary | ICD-10-CM

## 2024-04-20 LAB — URINALYSIS, ROUTINE W REFLEX MICROSCOPIC
Bilirubin Urine: NEGATIVE
Glucose, UA: NEGATIVE mg/dL
Ketones, ur: NEGATIVE mg/dL
Leukocytes,Ua: NEGATIVE
Nitrite: NEGATIVE
Protein, ur: NEGATIVE mg/dL
Specific Gravity, Urine: 1.025 (ref 1.005–1.030)
pH: 5 (ref 5.0–8.0)

## 2024-04-20 LAB — WET PREP, GENITAL
Sperm: NONE SEEN
Trich, Wet Prep: NONE SEEN
WBC, Wet Prep HPF POC: <10 (ref <10)
Yeast Wet Prep HPF POC: NONE SEEN

## 2024-04-20 LAB — POCT PREGNANCY, URINE: Preg Test, Ur: POSITIVE — AB

## 2024-04-20 LAB — CBC
HCT: 33.4 % — ABNORMAL LOW (ref 36.0–46.0)
Hemoglobin: 10.8 g/dL — ABNORMAL LOW (ref 12.0–15.0)
MCH: 28.4 pg (ref 26.0–34.0)
MCHC: 32.3 g/dL (ref 30.0–36.0)
MCV: 87.9 fL (ref 80.0–100.0)
Platelets: 415 10*3/uL — ABNORMAL HIGH (ref 150–400)
RBC: 3.8 MIL/uL — ABNORMAL LOW (ref 3.87–5.11)
RDW: 14.5 % (ref 11.5–15.5)
WBC: 16.3 10*3/uL — ABNORMAL HIGH (ref 4.0–10.5)
nRBC: 0 % (ref 0.0–0.2)

## 2024-04-20 LAB — ABO/RH: ABO/RH(D): O POS

## 2024-04-20 LAB — HCG, QUANTITATIVE, PREGNANCY: hCG, Beta Chain, Quant, S: 303 m[IU]/mL — ABNORMAL HIGH (ref <5)

## 2024-04-20 MED ORDER — METRONIDAZOLE 500 MG PO TABS: 500.0000 mg | ORAL_TABLET | Freq: Two times a day (BID) | ORAL | 0 refills | Status: AC

## 2024-04-20 NOTE — MAU Note (Signed)
 Olivia Thompson is a 28 y.o. at [redacted]w[redacted]d here in MAU reporting pink spotting today. She noted the spotting after straining to have a BM. Occ pain but feels better after passing gas. No pain while in Triage. Last intercourse 2 days ago. Had positive upts 2 wks ago. Had miscarriage in January but did not follow up after knowing her quants were starting to fall.   LMP: 03/10/24 Onset of complaint: today Pain score: 0 Vitals:   04/20/24 1914 04/20/24 1919  BP:  (!) 141/78  Pulse: (!) 105   Resp: 17   Temp: 98.5 F (36.9 C)   SpO2: 100%      FHT: n/a  Lab orders placed from triage: upt

## 2024-04-20 NOTE — Discharge Instructions (Signed)

## 2024-04-20 NOTE — ED Notes (Signed)
 Pt stated she had taken a home preg test last week and same was positive.  Pt here for spotting. Pt stated the last time she came in for this she was sent directly to MAU. Pt ambulated to MAU w/o distress and fast gait. Charge nurse aware I took pt to MAU.

## 2024-04-20 NOTE — Progress Notes (Signed)
 Dr Scherrie Curt in [facility] RM to see pt and discuss test results. Written and verbal d/c instructions given and pt voiced understanding. PT then d/c home by MD

## 2024-04-20 NOTE — MAU Provider Note (Signed)
 Spotting    S [name] is a 28 y.o. G70P1011 pregnant female at [redacted]w[redacted]d who presents to MAU today with complaint of vaginal pink spotting. Pt states spotting began after straining to have a BM.  She states she has occasional pains but feels better passing gas. Denies pain now.  Last intercourse 5/11.  +UPT 2 weeks ago after miscarriage 12/2023.  Pt states she didn't follow up after to know if her bhCG levels fell appropriately.    Pertinent items noted in HPI and remainder of comprehensive ROS otherwise negative.   O BP (!) 141/78   Pulse (!) 105   Temp 98.5 F (36.9 C)   Resp 17   Ht 5' (1.524 m)   Wt 87.5 kg   LMP 03/10/2024   SpO2 100%   Breastfeeding Unknown   BMI 37.69 kg/m  Physical Exam Vitals and nursing note reviewed. Exam conducted with a chaperone present.  Constitutional:      General: She is not in acute distress.    Appearance: Normal appearance. She is not ill-appearing.  HENT:     Head: Normocephalic and atraumatic.     Right Ear: External ear normal.     Left Ear: External ear normal.     Nose: Nose normal. No congestion.     Mouth/Throat:     Mouth: Mucous membranes are moist.     Pharynx: Oropharynx is clear.  Eyes:     Extraocular Movements: Extraocular movements intact.     Conjunctiva/sclera: Conjunctivae normal.  Cardiovascular:     Rate and Rhythm: Normal rate.  Pulmonary:     Effort: Pulmonary effort is normal.     Breath sounds: Normal breath sounds.  Abdominal:     General: Bowel sounds are normal. There is no distension.     Palpations: Abdomen is soft.     Tenderness: There is no abdominal tenderness.  Genitourinary:    Rectum: Normal.  Musculoskeletal:        General: No swelling. Normal range of motion.     Cervical back: Normal range of motion.  Skin:    General: Skin is warm and dry.  Neurological:     Mental Status: She is alert and oriented to person, place, and time. Mental status is at baseline.     Motor: No weakness.      Gait: Gait normal.  Psychiatric:        Mood and Affect: Mood normal.        Behavior: Behavior normal.      MDM: MAU Course: UA moderate Hgb, noninfectious GC collected Wet Prep with clue cells  Upreg +  CBC leukocytosis 16.3, Hgb 10.8 (11.4 27mo ago) ABO O+ hCG 303  TVUS = prelim read showing some free fluid in pelvis, R adnexal mass not typical appearing for ectopic and heterogenous endometrium with thin / empty intrauterine stripe ^^^Radiology called: concerned for ectopic as they believe the R adnexal collection if hemorrhagic fluid and empty uterus  Spoke with attending Dr. Dodie Frees and if hCG >2500 call back as concern for ectopic, however if <2500 repeat quant in 48hrs.   Spoke with patient and she will follow up at MAU for repeat hCG.  Pt understanding and agreeable, given ectopic precautions.    AP #[redacted] weeks gestation #BV - sent flagyl   #R adnexal hemorrhagic collection - repeat quant in MAU 5/15  Discharge from MAU in stable condition with strict/usual precautions Follow up at desired OBGYN as scheduled for ongoing prenatal  care  Allergies as of 04/20/2024       Reactions   Amoxicillin     Yeast infection        Medication List     TAKE these medications    metroNIDAZOLE  500 MG tablet Commonly known as: FLAGYL  Take 1 tablet (500 mg total) by mouth 2 (two) times daily.   PrePLUS 27-1 MG Tabs Take 1 tablet by mouth daily.        Olivia Goldstein, MD 04/20/2024 10:17 PM

## 2024-04-22 ENCOUNTER — Inpatient Hospital Stay (HOSPITAL_COMMUNITY)
Admission: AD | Admit: 2024-04-22 | Discharge: 2024-04-22 | Disposition: A | Discharge status: Home / Self Care | Payer: Self-pay | Attending: Obstetrics & Gynecology | Admitting: Obstetrics & Gynecology

## 2024-04-22 ENCOUNTER — Ambulatory Visit: Payer: Self-pay | Admitting: Obstetrics & Gynecology

## 2024-04-22 DIAGNOSIS — O200 Threatened abortion: Secondary | ICD-10-CM | POA: Diagnosis not present

## 2024-04-22 DIAGNOSIS — R198 Other specified symptoms and signs involving the digestive system and abdomen: Secondary | ICD-10-CM

## 2024-04-22 DIAGNOSIS — Z3A01 Less than 8 weeks gestation of pregnancy: Secondary | ICD-10-CM | POA: Diagnosis not present

## 2024-04-22 DIAGNOSIS — O209 Hemorrhage in early pregnancy, unspecified: Secondary | ICD-10-CM | POA: Diagnosis not present

## 2024-04-22 LAB — GC/CHLAMYDIA PROBE AMP (~~LOC~~) NOT AT ARMC
Chlamydia: NEGATIVE
Comment: NEGATIVE
Comment: NORMAL
Neisseria Gonorrhea: NEGATIVE

## 2024-04-22 LAB — HCG, QUANTITATIVE, PREGNANCY: hCG, Beta Chain, Quant, S: 111 m[IU]/mL — ABNORMAL HIGH (ref <5)

## 2024-04-22 NOTE — MAU Provider Note (Cosign Needed)
   S [name] is a 28 y.o. G3P1011 patient who presents to MAU today for a follow up hcg after her visit on 04/20/24. Plan is to trend hcg quants due to early gestation with no visible IUP on ultrasound and a Right adnexal hemorrhagic collection seen on ultrasound 04/20/24. Patient reports that she has some mild bleeding today and left sided abdominal cramping. Denies fever, Chills, N/V and offers no other c/o at this time   O BP 129/77 (BP Location: Right Arm)   Pulse (!) 110   Temp 98.4 F (36.9 C) (Oral)   Resp 17   Ht 5' (1.524 m)   Wt 87.4 kg   LMP 03/10/2024   SpO2 100%   BMI 37.61 kg/m  Physical Exam Vitals and nursing note reviewed.  Constitutional:      General: She is not in acute distress.    Appearance: Normal appearance. She is obese. She is not ill-appearing.  HENT:     Head: Normocephalic.     Nose: Nose normal.     Mouth/Throat:     Mouth: Mucous membranes are moist.  Cardiovascular:     Rate and Rhythm: Normal rate.  Pulmonary:     Effort: Pulmonary effort is normal.  Abdominal:     Palpations: Abdomen is soft.  Musculoskeletal:     Cervical back: Normal range of motion.  Skin:    General: Skin is warm.  Neurological:     Mental Status: She is alert and oriented to person, place, and time.  Psychiatric:        Mood and Affect: Mood normal.        Behavior: Behavior normal.     MDM  Orders Placed This Encounter  Procedures   hCG, quantitative, pregnancy    Standing Status:   Standing    Number of Occurrences:   1      No results found for this or any previous visit (from the past 24 hours).      ASSESSMENT Medical screening exam complete @DX @ @DIAGMED @     PLAN Future Appointments  Date Time Provider Department Center  11/19/2024  8:00 AM Chrzanowski, Aram Beagle, NP [facility] None      Cherlynn Cornfield, NP 04/22/2024 7:44 PM

## 2024-04-22 NOTE — MAU Provider Note (Incomplete Revision)
   [name] is a 28 y.o. G3P1011 patient who presents to MAU today for a follow up hcg after her visit on 04/20/24. Plan is to trend hcg quants due to early gestation with no visible IUP on ultrasound and a Right adnexal hemorrhagic collection seen on ultrasound 04/20/24. Patient reports that she has some mild bleeding today and left sided abdominal cramping. Denies fever, Chills, N/V and offers no other c/o at this time   O BP 129/77 (BP Location: Right Arm)   Pulse (!) 110   Temp 98.4 F (36.9 C) (Oral)   Resp 17   Ht 5' (1.524 m)   Wt 87.4 kg   LMP 03/10/2024   SpO2 100%   BMI 37.61 kg/m  Physical Exam Vitals and nursing note reviewed.  Constitutional:      General: She is not in acute distress.    Appearance: Normal appearance. She is obese. She is not ill-appearing.  HENT:     Head: Normocephalic.     Nose: Nose normal.     Mouth/Throat:     Mouth: Mucous membranes are moist.  Cardiovascular:     Rate and Rhythm: Normal rate.  Pulmonary:     Effort: Pulmonary effort is normal.  Abdominal:     Palpations: Abdomen is soft.  Musculoskeletal:     Cervical back: Normal range of motion.  Skin:    General: Skin is warm.  Neurological:     Mental Status: She is alert and oriented to person, place, and time.  Psychiatric:        Mood and Affect: Mood normal.        Behavior: Behavior normal.     MDM  Orders Placed This Encounter  Procedures   hCG, quantitative, pregnancy    Standing Status:   Standing    Number of Occurrences:   1      No results found for this or any previous visit (from the past 24 hours).      ASSESSMENT Medical screening exam complete @DX @ @DIAGMED @     PLAN Future Appointments  Date Time Provider Department Center  11/19/2024  8:00 AM Chrzanowski, Aram Beagle, NP [facility] None      Cherlynn Cornfield, NP 04/22/2024 7:44 PM

## 2024-04-22 NOTE — MAU Note (Signed)
 Olivia Thompson is a 28 y.o. at [redacted]w[redacted]d here in MAU reporting: here for repeat HCG level.  No pain. Feeling better. No bleeding yesterday, some pink spotting today.  Has had slight pain in lower back, it was brief.  Onset of complaint: ongoing Pain score: none Vitals:   04/22/24 1902  BP: 129/77  Pulse: (!) 110  Resp: 17  Temp: 98.4 F (36.9 C)  SpO2: 100%      Lab orders placed from triage:  HCG ordered. To lobby to wait on draw

## 2024-04-29 ENCOUNTER — Other Ambulatory Visit

## 2024-05-20 ENCOUNTER — Ambulatory Visit (INDEPENDENT_AMBULATORY_CARE_PROVIDER_SITE_OTHER): Admitting: Radiology

## 2024-05-20 ENCOUNTER — Encounter: Payer: Self-pay | Admitting: Radiology

## 2024-05-20 VITALS — BP 102/64 | Wt 188.0 lb

## 2024-05-20 DIAGNOSIS — O021 Missed abortion: Secondary | ICD-10-CM

## 2024-05-20 DIAGNOSIS — N96 Recurrent pregnancy loss: Secondary | ICD-10-CM | POA: Diagnosis not present

## 2024-05-20 NOTE — Progress Notes (Signed)
      Subjective: Olivia Thompson is a 28 y.o. female G3P1 here for follow up after having miscarriage 04/2024. Concerned about having 2 miscarriages this year (12/2023 and 04/2024 treated at MAU). Not taking PNV. Desires pregnancy   Latest Reference Range & Units 04/20/24 21:06 04/22/24 19:57  HCG, Beta Chain, Quant, S <5 mIU/mL 303 (H) 111 (H)  (H): Data is abnormally high  Narrative & Impression  CLINICAL DATA:  Vaginal bleeding   EXAM: OBSTETRIC <14 WK US  AND TRANSVAGINAL OB US    TECHNIQUE: Both transabdominal and transvaginal ultrasound examinations were performed for complete evaluation of the gestation as well as the maternal uterus, adnexal regions, and pelvic cul-de-sac. Transvaginal technique was performed to assess early pregnancy.   COMPARISON:  None Available.   FINDINGS: Intrauterine gestational sac: None   Yolk sac:  Not Visualized.   Embryo:  Not Visualized.   Maternal uterus/adnexae: Left ovary is within normal limits and measures 2.1 x 1.6 x 1.7 cm. Right ovary measures 2.5 x 3.7 x 2.7 cm. Small moderate free fluid in the pelvis. Best seen on seen a images is complex slightly echogenic material in the right adnexa without discretely measurable mass.   IMPRESSION: 1. No IUP identified. Small moderate free fluid in the pelvis. Slightly complex echogenic material in right adnexa with an appearance suggesting hemorrhagic material on cine images, though no discretely measurable adnexal mass is seen. Given positive pregnancy test, no IUP, and complex appearing fluid in the right adnexa, concern raised for ectopic pregnancy. Correlation with the patient's quantitative HCG (pending at time of dictation) is strongly advised.   Critical Value/emergent results were called by telephone at the time of interpretation on 04/20/2024 at 9:43 pm to provider CASEY CHUBB , who verbally acknowledged these results.     Electronically Signed   By: Esmeralda Hedge M.D.   On:  04/20/2024 21:43    Review of Systems  All other systems reviewed and are negative.   Past Medical History:  Diagnosis Date  . ADHD   . Anxiety       Objective:  Today's Vitals   05/20/24 1052  BP: 102/64  Weight: 188 lb (85.3 kg)   Body mass index is 36.72 kg/m.   Physical Exam Vitals and nursing note reviewed. Exam conducted with a chaperone present.  Constitutional:      Appearance: Normal appearance. She is well-developed.  Pulmonary:     Effort: Pulmonary effort is normal.  Abdominal:     General: Abdomen is flat.     Palpations: Abdomen is soft.  Genitourinary:    General: Normal vulva.     Vagina: No erythema, bleeding or lesions.     Cervix: Normal. No discharge, friability, lesion or erythema.     Uterus: Normal.      Adnexa: Right adnexa normal and left adnexa normal.   Neurological:     Mental Status: She is alert.   Psychiatric:        Mood and Affect: Mood normal.        Thought Content: Thought content normal.        Judgment: Judgment normal.   Ellis Guys, CMA present for exam  Assessment:/Plan:   1. Missed abortion (Primary) - B-HCG Quant  Will repeat quant today- will contact with results Discussed common miscarriage reasons Restart PNV Declines any pelvic pain or bleeding Will establish with an OB for future pregnancy.   Briseis Aguilera B, NP 11:09 AM

## 2024-05-21 ENCOUNTER — Ambulatory Visit: Payer: Self-pay | Admitting: Radiology

## 2024-05-21 LAB — HCG, QUANTITATIVE, PREGNANCY: HCG, Total, QN: <5 m[IU]/mL

## 2024-11-19 ENCOUNTER — Ambulatory Visit: Payer: Medicaid Other | Admitting: Radiology
